# Patient Record
Sex: Male | Born: 1950 | Race: White | Hispanic: No | Marital: Married | Smoking: Never smoker
Health system: Southern US, Community
[De-identification: ages and names within clinical notes are randomized; demographics above are authoritative.]

## PROBLEM LIST (undated history)

## (undated) DIAGNOSIS — I2510 Atherosclerotic heart disease of native coronary artery without angina pectoris: Principal | ICD-10-CM

## (undated) DIAGNOSIS — I10 Essential (primary) hypertension: Secondary | ICD-10-CM

## (undated) DIAGNOSIS — E118 Type 2 diabetes mellitus with unspecified complications: Secondary | ICD-10-CM

## (undated) DIAGNOSIS — Z951 Presence of aortocoronary bypass graft: Secondary | ICD-10-CM

## (undated) DIAGNOSIS — E66811 Obesity, class 1: Secondary | ICD-10-CM

## (undated) DIAGNOSIS — I214 Non-ST elevation (NSTEMI) myocardial infarction: Secondary | ICD-10-CM

## (undated) DIAGNOSIS — G4733 Obstructive sleep apnea (adult) (pediatric): Secondary | ICD-10-CM

## (undated) DIAGNOSIS — E669 Obesity, unspecified: Secondary | ICD-10-CM

## (undated) DIAGNOSIS — Z9861 Coronary angioplasty status: Secondary | ICD-10-CM

## (undated) DIAGNOSIS — E785 Hyperlipidemia, unspecified: Secondary | ICD-10-CM

## (undated) DIAGNOSIS — Z9989 Dependence on other enabling machines and devices: Secondary | ICD-10-CM

## (undated) DIAGNOSIS — I350 Nonrheumatic aortic (valve) stenosis: Secondary | ICD-10-CM

## (undated) DIAGNOSIS — R7989 Other specified abnormal findings of blood chemistry: Secondary | ICD-10-CM

## (undated) HISTORY — DX: Type 2 diabetes mellitus with unspecified complications: E11.8

## (undated) HISTORY — DX: Atherosclerotic heart disease of native coronary artery without angina pectoris: I25.10

## (undated) HISTORY — DX: Other specified abnormal findings of blood chemistry: R79.89

## (undated) HISTORY — PX: VASECTOMY: SHX75

## (undated) HISTORY — DX: Coronary angioplasty status: Z98.61

## (undated) HISTORY — DX: Obesity, unspecified: E66.9

## (undated) HISTORY — PX: NM MYOCAR PERF WALL MOTION: HXRAD629

## (undated) HISTORY — DX: Essential (primary) hypertension: I10

## (undated) HISTORY — DX: Non-ST elevation (NSTEMI) myocardial infarction: I21.4

## (undated) HISTORY — DX: Presence of aortocoronary bypass graft: Z95.1

## (undated) HISTORY — DX: Nonrheumatic aortic (valve) stenosis: I35.0

## (undated) HISTORY — DX: Obstructive sleep apnea (adult) (pediatric): G47.33

## (undated) HISTORY — DX: Hyperlipidemia, unspecified: E78.5

## (undated) HISTORY — DX: Dependence on other enabling machines and devices: Z99.89

## (undated) HISTORY — PX: US ECHOCARDIOGRAPHY: HXRAD669

## (undated) HISTORY — DX: Obesity, class 1: E66.811

---

## 2000-01-06 HISTORY — PX: SHOULDER SURGERY: SHX246

## 2003-01-06 DIAGNOSIS — Z951 Presence of aortocoronary bypass graft: Secondary | ICD-10-CM

## 2003-01-06 DIAGNOSIS — I2510 Atherosclerotic heart disease of native coronary artery without angina pectoris: Secondary | ICD-10-CM

## 2003-01-06 HISTORY — DX: Presence of aortocoronary bypass graft: Z95.1

## 2003-01-06 HISTORY — DX: Atherosclerotic heart disease of native coronary artery without angina pectoris: I25.10

## 2003-07-13 HISTORY — PX: CARDIAC CATHETERIZATION: SHX172

## 2003-08-24 HISTORY — PX: CORONARY ARTERY BYPASS GRAFT: SHX141

## 2009-05-05 DIAGNOSIS — I2510 Atherosclerotic heart disease of native coronary artery without angina pectoris: Secondary | ICD-10-CM | POA: Insufficient documentation

## 2009-05-05 DIAGNOSIS — I214 Non-ST elevation (NSTEMI) myocardial infarction: Secondary | ICD-10-CM

## 2009-05-05 DIAGNOSIS — Z9861 Coronary angioplasty status: Secondary | ICD-10-CM

## 2009-05-05 HISTORY — DX: Atherosclerotic heart disease of native coronary artery without angina pectoris: I25.10

## 2009-05-05 HISTORY — DX: Coronary angioplasty status: Z98.61

## 2009-05-05 HISTORY — DX: Non-ST elevation (NSTEMI) myocardial infarction: I21.4

## 2009-05-15 ENCOUNTER — Encounter: Admission: RE | Admit: 2009-05-15 | Discharge: 2009-05-15 | Payer: Self-pay | Admitting: Cardiology

## 2009-05-17 ENCOUNTER — Ambulatory Visit (HOSPITAL_COMMUNITY): Admission: RE | Admit: 2009-05-17 | Discharge: 2009-05-19 | Payer: Self-pay | Admitting: Cardiology

## 2009-05-17 HISTORY — PX: CORONARY ANGIOPLASTY WITH STENT PLACEMENT: SHX49

## 2010-03-24 LAB — BASIC METABOLIC PANEL
Calcium: 8.9 mg/dL (ref 8.4–10.5)
GFR calc non Af Amer: >60 mL/min (ref >60)
Glucose, Bld: 126 mg/dL — ABNORMAL HIGH (ref 70–99)
Potassium: 4.3 mEq/L (ref 3.5–5.1)
Sodium: 140 mEq/L (ref 135–145)

## 2010-03-24 LAB — CBC
Platelets: 131 10*3/uL — ABNORMAL LOW (ref 150–400)
RBC: 4.46 MIL/uL (ref 4.22–5.81)
WBC: 7.3 10*3/uL (ref 4.0–10.5)

## 2010-03-24 LAB — GLUCOSE, CAPILLARY: Glucose-Capillary: 129 mg/dL — ABNORMAL HIGH (ref 70–99)

## 2010-03-24 LAB — CARDIAC PANEL(CRET KIN+CKTOT+MB+TROPI): CK, MB: 7.6 ng/mL (ref 0.3–4.0)

## 2010-03-25 LAB — GLUCOSE, CAPILLARY
Glucose-Capillary: 142 mg/dL — ABNORMAL HIGH (ref 70–99)
Glucose-Capillary: 156 mg/dL — ABNORMAL HIGH (ref 70–99)
Glucose-Capillary: 223 mg/dL — ABNORMAL HIGH (ref 70–99)
Glucose-Capillary: 272 mg/dL — ABNORMAL HIGH (ref 70–99)

## 2010-03-25 LAB — CBC
Hemoglobin: 13.5 g/dL (ref 13.0–17.0)
MCHC: 35.2 g/dL (ref 30.0–36.0)
RBC: 4.31 MIL/uL (ref 4.22–5.81)
RBC: 4.36 MIL/uL (ref 4.22–5.81)
RDW: 13.1 % (ref 11.5–15.5)
RDW: 13.2 % (ref 11.5–15.5)
WBC: 7.2 10*3/uL (ref 4.0–10.5)

## 2010-03-25 LAB — CARDIAC PANEL(CRET KIN+CKTOT+MB+TROPI)
CK, MB: 16.2 ng/mL (ref 0.3–4.0)
Relative Index: 4.2 — ABNORMAL HIGH (ref 0.0–2.5)
Relative Index: 4.3 — ABNORMAL HIGH (ref 0.0–2.5)
Relative Index: 5.9 — ABNORMAL HIGH (ref 0.0–2.5)
Total CK: 322 U/L — ABNORMAL HIGH (ref 7–232)
Total CK: 353 U/L — ABNORMAL HIGH (ref 7–232)
Troponin I: 3.36 ng/mL (ref 0.00–0.06)
Troponin I: 3.64 ng/mL (ref 0.00–0.06)
Troponin I: 4.77 ng/mL (ref 0.00–0.06)

## 2010-03-25 LAB — BASIC METABOLIC PANEL
BUN: 13 mg/dL (ref 6–23)
CO2: 28 mEq/L (ref 19–32)
Chloride: 100 mEq/L (ref 96–112)
Creatinine, Ser: 1.12 mg/dL (ref 0.4–1.5)
GFR calc Af Amer: >60 mL/min (ref >60)
Glucose, Bld: 100 mg/dL — ABNORMAL HIGH (ref 70–99)
Potassium: 4.4 mEq/L (ref 3.5–5.1)

## 2010-04-06 DIAGNOSIS — I350 Nonrheumatic aortic (valve) stenosis: Secondary | ICD-10-CM

## 2010-04-06 HISTORY — DX: Nonrheumatic aortic (valve) stenosis: I35.0

## 2012-05-23 ENCOUNTER — Other Ambulatory Visit: Payer: Self-pay | Admitting: *Deleted

## 2012-05-23 MED ORDER — SIMVASTATIN 40 MG PO TABS: 40.0000 mg | ORAL_TABLET | Freq: Every day | ORAL | Status: DC

## 2012-08-10 ENCOUNTER — Telehealth: Payer: Self-pay | Admitting: Cardiology

## 2012-08-10 NOTE — Telephone Encounter (Signed)
Please call-was waiting for a letter from Dr Herbie Baltimore for his insurance company. This have been over a month and have not heard anything.

## 2012-08-10 NOTE — Telephone Encounter (Signed)
Message forwarded to S. Martin, RN.  

## 2012-11-22 ENCOUNTER — Other Ambulatory Visit: Payer: Self-pay | Admitting: *Deleted

## 2012-11-22 MED ORDER — LOSARTAN POTASSIUM 100 MG PO TABS: 100.0000 mg | ORAL_TABLET | Freq: Every day | ORAL | Status: DC

## 2012-11-22 NOTE — Telephone Encounter (Signed)
E-scribed losartan 100 mg #30 x 11 refills

## 2013-03-07 ENCOUNTER — Other Ambulatory Visit (HOSPITAL_COMMUNITY): Payer: Self-pay | Admitting: Cardiology

## 2013-03-07 DIAGNOSIS — I350 Nonrheumatic aortic (valve) stenosis: Secondary | ICD-10-CM

## 2013-03-14 ENCOUNTER — Ambulatory Visit (HOSPITAL_COMMUNITY)
Admission: RE | Admit: 2013-03-14 | Discharge: 2013-03-14 | Disposition: A | Discharge status: Home / Self Care | Payer: Commercial Managed Care - PPO | Source: Ambulatory Visit | Attending: Cardiovascular Disease | Admitting: Cardiovascular Disease

## 2013-03-14 DIAGNOSIS — I700 Atherosclerosis of aorta: Secondary | ICD-10-CM | POA: Insufficient documentation

## 2013-03-14 DIAGNOSIS — I359 Nonrheumatic aortic valve disorder, unspecified: Secondary | ICD-10-CM

## 2013-03-14 DIAGNOSIS — I350 Nonrheumatic aortic (valve) stenosis: Secondary | ICD-10-CM

## 2013-03-14 NOTE — Progress Notes (Signed)
2D Echo Performed 03/14/2013    Katrin Grabel, RCS  

## 2013-03-16 ENCOUNTER — Telehealth: Payer: Self-pay | Admitting: *Deleted

## 2013-03-16 DIAGNOSIS — R932 Abnormal findings on diagnostic imaging of liver and biliary tract: Secondary | ICD-10-CM

## 2013-03-16 NOTE — Telephone Encounter (Signed)
Spoke to patient. Result given . Patient is aware that a right quadrant Ultrasound is needed  R/O density in the liver. WILL FORWARD TO Dr Jeneen Rinks LITTLE.Verbalized understanding pa

## 2013-03-16 NOTE — Telephone Encounter (Signed)
Message copied by Raiford Simmonds on Thu Mar 16, 2013  3:38 PM ------      Message from: Leonie Man      Created: Wed Mar 15, 2013  1:49 AM       Normal left ventricular function with mild LVH (wall thickening); abnormal diastolic function, grade 2 with high filling pressures.      Mild aortic stenosis.            Suggestion of a density in the liver.  A dedicated right upper quadrant ultrasound is recommended. -- We should go ahead and order this and forward to PCP. ------

## 2013-03-28 ENCOUNTER — Ambulatory Visit (HOSPITAL_COMMUNITY)
Admission: RE | Admit: 2013-03-28 | Discharge: 2013-03-28 | Disposition: A | Discharge status: Home / Self Care | Payer: Commercial Managed Care - PPO | Source: Ambulatory Visit | Attending: Cardiology | Admitting: Cardiology

## 2013-03-28 DIAGNOSIS — R932 Abnormal findings on diagnostic imaging of liver and biliary tract: Secondary | ICD-10-CM | POA: Insufficient documentation

## 2013-03-28 NOTE — Progress Notes (Signed)
Quick Note:  Reassuring findings on the ultrasound. The area in question: Xience with the gallbladder. There is evidence of multiple gallstones with sludge in the gallbladder with evidence of fatty liver changes.  Will defer further management to his PCP.  Leonie Man, MD  ______

## 2013-04-03 ENCOUNTER — Telehealth: Payer: Self-pay | Admitting: Cardiology

## 2013-04-03 ENCOUNTER — Other Ambulatory Visit (HOSPITAL_COMMUNITY): Payer: Self-pay | Admitting: Cardiology

## 2013-04-03 NOTE — Telephone Encounter (Signed)
Returned call.  No answer and voicemail is work phone.  No message left as pt was not the caller.  Message forwarded to Ivin Booty, [place].

## 2013-04-03 NOTE — Telephone Encounter (Signed)
He had an ultrasound at [facility] last Tuesday. She wants to know if his results are back?

## 2013-04-03 NOTE — Telephone Encounter (Signed)
Rx was sent to pharmacy electronically. 

## 2013-04-04 NOTE — Telephone Encounter (Signed)
Message copied by Raiford Simmonds on Tue Apr 04, 2013  8:38 AM ------      Message from: Leonie Man      Created: Tue Mar 28, 2013 11:47 PM       Reassuring findings on the ultrasound. The area in question: Xience with the gallbladder. There is evidence of multiple gallstones with sludge in the gallbladder with evidence of fatty liver changes.            Will defer further management to his PCP.            Leonie Man, MD       ------

## 2013-04-04 NOTE — Telephone Encounter (Signed)
No answer, left message to call back

## 2013-04-07 NOTE — Telephone Encounter (Signed)
Spoke to patient. Result given . Verbalized understanding Patient states he has an apointment with Dr Tamsen Roers.

## 2013-05-11 ENCOUNTER — Telehealth: Payer: Self-pay | Admitting: Cardiology

## 2013-05-11 ENCOUNTER — Other Ambulatory Visit (HOSPITAL_COMMUNITY): Payer: Self-pay | Admitting: Cardiology

## 2013-05-11 NOTE — Telephone Encounter (Signed)
Rx was sent to pharmacy electronically. 

## 2013-05-12 NOTE — Telephone Encounter (Signed)
closd encounter

## 2013-05-23 ENCOUNTER — Other Ambulatory Visit: Payer: Self-pay | Admitting: Cardiovascular Disease

## 2013-05-23 NOTE — Telephone Encounter (Signed)
Rx was sent to pharmacy electronically. 

## 2013-05-30 ENCOUNTER — Other Ambulatory Visit: Payer: Self-pay | Admitting: Cardiology

## 2013-06-02 NOTE — Telephone Encounter (Signed)
Rx was sent to pharmacy electronically. 

## 2013-06-06 ENCOUNTER — Other Ambulatory Visit (HOSPITAL_COMMUNITY): Payer: Self-pay | Admitting: Cardiology

## 2013-06-13 NOTE — Telephone Encounter (Signed)
Rx was sent to pharmacy electronically. 

## 2013-06-14 ENCOUNTER — Encounter: Payer: Self-pay | Admitting: *Deleted

## 2013-06-16 ENCOUNTER — Encounter: Payer: Self-pay | Admitting: Cardiology

## 2013-06-20 ENCOUNTER — Encounter: Payer: Self-pay | Admitting: Cardiology

## 2013-06-20 ENCOUNTER — Ambulatory Visit (INDEPENDENT_AMBULATORY_CARE_PROVIDER_SITE_OTHER): Payer: Commercial Managed Care - PPO | Admitting: Cardiology

## 2013-06-20 VITALS — BP 130/78 | HR 54 | Ht 68.0 in | Wt 219.1 lb

## 2013-06-20 DIAGNOSIS — Z951 Presence of aortocoronary bypass graft: Secondary | ICD-10-CM

## 2013-06-20 DIAGNOSIS — Z9861 Coronary angioplasty status: Secondary | ICD-10-CM

## 2013-06-20 DIAGNOSIS — G4733 Obstructive sleep apnea (adult) (pediatric): Secondary | ICD-10-CM

## 2013-06-20 DIAGNOSIS — I10 Essential (primary) hypertension: Secondary | ICD-10-CM

## 2013-06-20 DIAGNOSIS — Z9989 Dependence on other enabling machines and devices: Secondary | ICD-10-CM

## 2013-06-20 DIAGNOSIS — E118 Type 2 diabetes mellitus with unspecified complications: Secondary | ICD-10-CM

## 2013-06-20 DIAGNOSIS — E785 Hyperlipidemia, unspecified: Secondary | ICD-10-CM

## 2013-06-20 DIAGNOSIS — I2510 Atherosclerotic heart disease of native coronary artery without angina pectoris: Secondary | ICD-10-CM

## 2013-06-20 DIAGNOSIS — E669 Obesity, unspecified: Secondary | ICD-10-CM

## 2013-06-20 NOTE — Patient Instructions (Signed)
Your physician recommends that you schedule a follow-up appointment in: 1 year  

## 2013-06-24 ENCOUNTER — Encounter: Payer: Self-pay | Admitting: Cardiology

## 2013-06-24 DIAGNOSIS — E118 Type 2 diabetes mellitus with unspecified complications: Secondary | ICD-10-CM | POA: Insufficient documentation

## 2013-06-24 DIAGNOSIS — E66811 Obesity, class 1: Secondary | ICD-10-CM | POA: Insufficient documentation

## 2013-06-24 DIAGNOSIS — I2510 Atherosclerotic heart disease of native coronary artery without angina pectoris: Secondary | ICD-10-CM | POA: Insufficient documentation

## 2013-06-24 DIAGNOSIS — Z9989 Dependence on other enabling machines and devices: Secondary | ICD-10-CM

## 2013-06-24 DIAGNOSIS — E785 Hyperlipidemia, unspecified: Secondary | ICD-10-CM | POA: Insufficient documentation

## 2013-06-24 DIAGNOSIS — E669 Obesity, unspecified: Secondary | ICD-10-CM | POA: Insufficient documentation

## 2013-06-24 DIAGNOSIS — G4733 Obstructive sleep apnea (adult) (pediatric): Secondary | ICD-10-CM | POA: Insufficient documentation

## 2013-06-24 DIAGNOSIS — Z951 Presence of aortocoronary bypass graft: Secondary | ICD-10-CM | POA: Insufficient documentation

## 2013-06-24 DIAGNOSIS — I10 Essential (primary) hypertension: Secondary | ICD-10-CM | POA: Insufficient documentation

## 2013-06-24 NOTE — Assessment & Plan Note (Addendum)
No active angina symptoms. Doing very well. With his weight loss, he is really not a positive high. No active symptoms.  Has 2 large BMS stents in the SVG-diagonal, with a Myoview done since it showed no evidence of ischemia. He is on stable dose of beta blocker, ARB, aspirin and statin.

## 2013-06-24 NOTE — Assessment & Plan Note (Signed)
The last recorded weight I have him as shown in March of last year UA 237 pounds currently down to 218 before that he was 2 and 43 pounds. He still actively working on his diet and try to get much activity as he can do.

## 2013-06-24 NOTE — Assessment & Plan Note (Signed)
Last stress test was in 2012, but probably need followup stress test for graft patency next year.

## 2013-06-24 NOTE — Assessment & Plan Note (Signed)
Blood pressure well controlled on current regimen

## 2013-06-24 NOTE — Assessment & Plan Note (Signed)
Has CAD associated with diabetes. Is on insulin and other oral medications.

## 2013-06-24 NOTE — Assessment & Plan Note (Signed)
On simvastatin, followed by PCP, but I do not have recent labs.

## 2013-06-24 NOTE — Progress Notes (Signed)
PATIENT: Nicholas Rosales MRN: [id number]  DOB: March 12, 1950   DOV:06/24/2013 PCP: Tamsen Roers, MD  Clinic Note: Chief Complaint  Patient presents with  . Annual Exam    NO CHEST PAIN. NO SOB ,SLIGHT EDEMA, LOST 10 LBS    HPI: Nicholas Rosales is a 63 y.o.  male with a PMH below who presents today for 15 month followup of his CAD with other risk factors. He had CABG in 2005 then PCI to the SVG-Diagonal into the LAD. An echocardiogram that suggested possible bicuspid aortic valve with mild. He does have a followup echocardiogram performed which really only showed stable mild aortic stenosis and regurgitation. He had previously planned to start testosterone for low T., but never actually started. He has been doing a whole lot more pain in his diet and trying to get some exercise. He's lost about 20 pounds all told. He gained some of back with family who came to visit for while..  Interval History: he actually says he is feeling better the weight loss. More energy and feels like doing more exercise. He had a fall off a long known to have some pain in his left upper arm. His 80s active chest tightness/pressure consistent with angina or dyspnea with rest or exertion. No PND, orthopnea or edema. No palpitations/irregular heartbeats, syncope/near syncope, TIA/amaurosis fugax.  No melena, hematochezia, hematuria or claudication. He is in much better glycemic control and weight loss. He is trying to get exercise but is also doing a good job watching his diet. The main extent of his exercises doing yard work, but no routine exercise.  Past Medical History  Diagnosis Date  . CAD, multiple vessel 2005    referred for CABG; the LAD:  Proximal 80%, 90% mid; circumflex 100%, nondominant RCA 90%  . S/P CABG x 4 2005    LIMA to LAD, SVG to OM, SVG-PDA, SVG-Diagonal  . CAD S/P percutaneous coronary angioplasty May 2011    Myoview - reversible anterolateral ischemia --> 70% SVG-diagonal --> PCI  . History of  percutaneous coronary intervention May 2011    SVG-Diag: mid - Verflex BMS 4.0 m x 28 mm (4.5 mm), distal 4.0 mm x 12 mm Liberte' BMS (4.5 mm);; Follow-up Myoview 04/2010 - 10 METS, no ischemia or infarction  . Mild aortic stenosis by prior echocardiogram 04/2010    EF 55%, Mod AoV calcification with Mild Stenosis (CRO bicuspid valve);; F/u Echo 03/2013: mild LVH, EF 55-60%, Gr 2DD , mild AS, mod LA dilation,  mild RA & RV dilation  . Non-STEMI (non-ST elevated myocardial infarction) May 2011    likely postprocedural  . Hypertension, essential, benign   . Dyslipidemia, goal LDL below 70   . Type 2 diabetes mellitus with complications     Cad  . OSA on CPAP   . Obesity (BMI 30.0-34.9)   . Low testosterone     Prior Cardiac Evaluation and Past Surgical History: Past Surgical History  Procedure Laterality Date  . Vasectomy    . Shoulder surgery  2002  . Coronary artery bypass graft  08/24/2003    [place]:  LIMA to LAD,SVG to first obtuse marginal,posterior descending to first diagonal.  . Cardiac catheterization  07/13/2003    significant 3 vessel disease  . Coronary angioplasty with stent placement  05/17/2009    SVG to the OM eccentric 70% area - overlapping stents placed  . Nm myocar perf wall motion  04/28/2010    normal  . US echocardiography  04/2010; 03/2013    mod. 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    No Known Allergies  Current Outpatient Prescriptions  Medication Sig Dispense Refill  . aspirin 81 MG tablet Take 81 mg by mouth daily.      . B Complex-C (SUPER B COMPLEX PO) Take 1 tablet by mouth daily.      Marland Kitchen BYSTOLIC 5 MG tablet TAKE ONE TABLET BY MOUTH EVERY DAY  30 tablet  0  . doxazosin (CARDURA) 4 MG tablet Take 4 mg by mouth daily.      . insulin glargine (LANTUS) 100 UNIT/ML injection Inject 20 Units into the skin at bedtime.       Marland Kitchen L-Lysine 500 MG CAPS Take 1 capsule by mouth daily.       Marland Kitchen losartan (COZAAR) 100 MG tablet Take 1 tablet (100 mg total) by mouth daily.  30 tablet  11  . metFORMIN (GLUCOPHAGE) 1000 MG tablet Take 1,000 mg by mouth 2 (two) times daily with a meal.      . omega-3 acid ethyl esters (LOVAZA) 1 G capsule TAKE 4 CAPSULES BY MOUTH AT BEDTIME.  120 capsule  0  . sertraline (ZOLOFT) 50 MG tablet Take 50 mg by mouth daily.      . simvastatin (ZOCOR) 40 MG tablet Take 1 tablet (40 mg total) by mouth daily. <must keep appointment>  90 tablet  0   No current facility-administered medications for this visit.    History   Social History Narrative   Married,Father of 2.   Currently still on disability and unemployed.   Does not smoke. Rare alcohol   Activity limited by shoulder pain.    ROS: A comprehensive Review of Systems - Negative except if not noted above in history of present illness.  PHYSICAL EXAM BP 130/78  Pulse 54  Ht 5\' 8"  (1.727 m)  Wt 219 lb 1.6 oz (99.383 kg)  BMI 33.32 kg/m2 General appearance: alert, cooperative, appears stated age, no distress and mildly obese Neck: no adenopathy, no carotid bruit, no JVD and supple, symmetrical, trachea midline Lungs: clear to auscultation bilaterally, normal percussion bilaterally and nonlabored, good air movement Heart: RRR, normal S1 and S2. 1-2/6C-D, early peaking SEMat L. units be radiating to carotids. Abdomen: soft, non-tender; bowel sounds normal; no masses,  no organomegaly Extremities: extremities normal, atraumatic, no cyanosis or edema   Adult ECG Report  Rate: 54 ;  Rhythm: sinus bradycardia and sinus arrhythmia   Narrative Interpretation: otherwise normal EKG   ASSESSMENT / PLAN: CAD S/P percutaneous coronary angioplasty No active angina symptoms. Doing very well. With his weight loss, he is really not a positive high. No active symptoms.  Has 2 large BMS stents in the SVG-diagonal, with a Myoview done since it showed no evidence of ischemia. He is on stable dose of beta  blocker, ARB, aspirin and statin.    S/P CABG x 4 Last stress test was in 2012, but probably need followup stress test for graft patency next year.  Hypertension, essential, benign Blood pressure well controlled on current regimen.  Dyslipidemia, goal LDL below 70 On simvastatin, followed by PCP, but I do not have recent labs.  Type 2 diabetes mellitus with complications Has CAD associated with diabetes. Is on insulin and other oral medications.  Obesity (BMI 30.0-34.9) The last recorded weight I have him as shown in March of last  year UA 237 pounds currently down to 218 before that he was 2 and 43 pounds. He still actively working on his diet and try to get much activity as he can do.    Orders Placed This Encounter  Procedures  . EKG 12-Lead   No orders of the defined types were placed in this encounter.    Followup: 1 year  DAVID W. Ellyn Hack, M.D., M.S. Interventional Cardiology [facility]

## 2013-07-11 ENCOUNTER — Other Ambulatory Visit: Payer: Self-pay | Admitting: Cardiology

## 2013-07-11 NOTE — Telephone Encounter (Signed)
Rx was sent to pharmacy electronically. 

## 2013-07-17 ENCOUNTER — Other Ambulatory Visit (HOSPITAL_COMMUNITY): Payer: Self-pay | Admitting: Cardiology

## 2013-07-17 NOTE — Telephone Encounter (Signed)
Rx was sent to pharmacy electronically. 

## 2013-08-31 ENCOUNTER — Other Ambulatory Visit: Payer: Self-pay | Admitting: Cardiology

## 2013-08-31 NOTE — Telephone Encounter (Signed)
Rx was sent to pharmacy electronically. 

## 2013-10-20 ENCOUNTER — Telehealth: Payer: Self-pay | Admitting: Cardiology

## 2013-10-20 NOTE — Telephone Encounter (Signed)
Returned call to patient's wife she stated husband went to [facility] ER this past Wed 10/18/13 for chest pain.Stated he was admitted for 1 night.Stated was told he had a irregular heart beat nothing to worry about.Stated he was given Motrin for chest pain and it is relieving pain.Stated they are going out of town next week and wanted Dr.Hardning to check husband to make sure he is ok.Records requested from [facility] scheduled with Dr.Harding Monday 10/23/13 at 2:15 pm.

## 2013-10-20 NOTE — Telephone Encounter (Signed)
OK..  Leonie Man, MD

## 2013-10-20 NOTE — Telephone Encounter (Signed)
Nicholas Rosales is calling because Nicholas Rosales went to the hospital on Tuesday night and they kept him for one night ans says he has an irregular heart beat and she is wanting to speak to someone about it . Please Call .   Thanks

## 2013-10-23 ENCOUNTER — Encounter: Payer: Self-pay | Admitting: Cardiology

## 2013-10-23 ENCOUNTER — Ambulatory Visit (INDEPENDENT_AMBULATORY_CARE_PROVIDER_SITE_OTHER): Payer: Commercial Managed Care - PPO | Admitting: Cardiology

## 2013-10-23 VITALS — BP 152/80 | HR 74 | Ht 69.0 in | Wt 223.2 lb

## 2013-10-23 DIAGNOSIS — E118 Type 2 diabetes mellitus with unspecified complications: Secondary | ICD-10-CM

## 2013-10-23 DIAGNOSIS — E669 Obesity, unspecified: Secondary | ICD-10-CM

## 2013-10-23 DIAGNOSIS — I10 Essential (primary) hypertension: Secondary | ICD-10-CM

## 2013-10-23 DIAGNOSIS — I2583 Coronary atherosclerosis due to lipid rich plaque: Principal | ICD-10-CM

## 2013-10-23 DIAGNOSIS — E785 Hyperlipidemia, unspecified: Secondary | ICD-10-CM

## 2013-10-23 DIAGNOSIS — E66811 Obesity, class 1: Secondary | ICD-10-CM

## 2013-10-23 DIAGNOSIS — I2510 Atherosclerotic heart disease of native coronary artery without angina pectoris: Secondary | ICD-10-CM

## 2013-10-23 DIAGNOSIS — Z9861 Coronary angioplasty status: Secondary | ICD-10-CM

## 2013-10-23 NOTE — Assessment & Plan Note (Signed)
On statin. Followed by PCP. Should be due for labs soon.

## 2013-10-23 NOTE — Assessment & Plan Note (Signed)
Borderline elevated today. We'll need to monitor. He should be seeing his PCP soon. We may need to consider potentially increasing Bystolic.

## 2013-10-23 NOTE — Progress Notes (Signed)
PCP: Tamsen Roers, MD  Clinic Note: Chief Complaint  Patient presents with  . Follow-up    ER visit; chest pains and some dizziness   HPI: Nicholas Rosales is a 63 y.o. male with a PMH below who presents today for postal followup. I last saw him in June of 2015 and he was doing relatively well at that time. He was just admitted to [facility] about a week ago for chest pain and was evaluated with negative biomarkers. He then had a Myoview that was negative for ischemia. The Myoview report is scanned into the results section.  Past Medical History  Diagnosis Date  . CAD, multiple vessel 2005    referred for CABG; the LAD:  Proximal 80%, 90% mid; circumflex 100%, nondominant RCA 90%  . S/P CABG x 4 2005    LIMA to LAD, SVG to OM, SVG-PDA, SVG-Diagonal  . CAD S/P percutaneous coronary angioplasty May 2011    05/2009: Myoview - reversible anterolateral ischemia --> 70% SVG-diagonal --> PCI;; b) Myoview 10/2013 @ [facility]  - No ischmia or infarction with Normal EF.  Marland Kitchen History of percutaneous coronary intervention May 2011    SVG-Diag: mid - Verflex BMS 4.0 m x 28 mm (4.5 mm), distal 4.0 mm x 12 mm Liberte' BMS (4.5 mm);; Follow-up Myoview 04/2010 - 10 METS, no ischemia or infarction  . Mild aortic stenosis by prior echocardiogram 04/2010    EF 55%, Mod AoV calcification with Mild Stenosis (CRO bicuspid valve);; F/u Echo 03/2013: mild LVH, EF 55-60%, Gr 2DD , mild AS, mod LA dilation,  mild RA & RV dilation  . Non-STEMI (non-ST elevated myocardial infarction) May 2011    likely postprocedural  . Hypertension, essential, benign   . Dyslipidemia, goal LDL below 70   . Type 2 diabetes mellitus with complications     Cad  . OSA on CPAP   . Obesity (BMI 30.0-34.9)   . Low testosterone     Prior Cardiovascular Evaluation /Procedure History: Procedure Laterality Date  . Coronary artery bypass graft  08/24/2003    [place]:  LIMA to LAD,SVG to first obtuse  marginal,posterior descending to first diagonal.  . Cardiac catheterization  07/13/2003    significant 3 vessel disease  . Coronary angioplasty with stent placement  05/17/2009    SVG to the OM eccentric 70% area - overlapping stents placed  . Nm myocar perf wall motion  04/28/2010; 10/2013    normal - EF 57% (scanned 10/15 from [facility])   . US echocardiography  04/2010; 03/2013    mod. 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   Interval History: He describes a chest discomfort he had his left upper chest almost midaxillary in nature. The most that treatment though he is pretty quite a bit. Is not necessarily associated with true exertion. No dyspnea associated with it. His hospitalization he has not had that much with her symptoms. Denies any other exertional chest tightness or pressure. No resting or exertional dyspnea.   No PND, orthopnea or edema. No palpitations, lightheadedness, dizziness, weakness or syncope/near syncope. No headache or blurred vision. No TIA/amaurosis fugax symptoms.   ROS: A comprehensive was performed. Review of Systems  Constitutional: Positive for malaise/fatigue.       Currently suffering from mild GI bug with diarrhea and fatigue  HENT: Negative for congestion, nosebleeds and  sore throat.   Eyes: Negative for blurred vision, double vision and discharge.  Respiratory: Positive for cough. Negative for hemoptysis, sputum production, shortness of breath and wheezing.        Occasional dry hacking cough  Cardiovascular: Negative for leg swelling.  Gastrointestinal: Positive for diarrhea. Negative for nausea, vomiting, blood in stool and melena.  Genitourinary: Negative for hematuria.  Musculoskeletal:       Mild arthralgias  Neurological: Positive for dizziness. Negative for sensory change, speech change, focal weakness, seizures, loss of consciousness and headaches.        Mild vertigo dizziness  Endo/Heme/Allergies: Does not bruise/bleed easily.  Psychiatric/Behavioral: Negative for depression. The patient is not nervous/anxious.   All other systems reviewed and are negative.   Current Outpatient Prescriptions on File Prior to Visit  Medication Sig Dispense Refill  . aspirin 81 MG tablet Take 81 mg by mouth daily.      . B Complex-C (SUPER B COMPLEX PO) Take 1 tablet by mouth daily.      Marland Kitchen BYSTOLIC 5 MG tablet TAKE ONE TABLET BY MOUTH EVERY DAY  30 tablet  10  . doxazosin (CARDURA) 4 MG tablet Take 4 mg by mouth daily.      . insulin glargine (LANTUS) 100 UNIT/ML injection Inject 20 Units into the skin at bedtime.       Marland Kitchen L-Lysine 500 MG CAPS Take 1 capsule by mouth daily.      Marland Kitchen losartan (COZAAR) 100 MG tablet Take 1 tablet (100 mg total) by mouth daily.  30 tablet  11  . metFORMIN (GLUCOPHAGE) 1000 MG tablet Take 1,000 mg by mouth 2 (two) times daily with a meal.      . omega-3 acid ethyl esters (LOVAZA) 1 G capsule TAKE 4 CAPSULES BY MOUTH AT BEDTIME.  120 capsule  10  . sertraline (ZOLOFT) 50 MG tablet Take 50 mg by mouth daily.      . simvastatin (ZOCOR) 40 MG tablet Take 1 tablet (40 mg total) by mouth daily.  90 tablet  2   No current facility-administered medications on file prior to visit.   ALLERGIES REVIEWED IN EPIC -- no change SOCIAL AND FAMILY HISTORY REVIEWED IN EPIC -- no change  Wt Readings from Last 3 Encounters:  10/23/13 223 lb 3.2 oz (101.243 kg)  06/20/13 219 lb 1.6 oz (99.383 kg)    PHYSICAL EXAM BP 152/80  Pulse 74  Ht 5\' 9"  (1.753 m)  Wt 223 lb 3.2 oz (101.243 kg)  BMI 32.95 kg/m2 General appearance: alert, cooperative, appears stated age, no distress and mildly obese  Neck: no adenopathy, no carotid bruit, no JVD and supple, symmetrical, trachea midline  Lungs: clear to auscultation bilaterally, normal percussion bilaterally and nonlabored, good air movement  Heart: RRR, normal S1 and S2. 1-2/6C-D, early peaking SEMat  L. units be radiating to carotids.  Abdomen: soft, non-tender; bowel sounds normal; no masses, no organomegaly  Extremities: extremities normal, atraumatic, no cyanosis or edema   Adult ECG Report  Rate: 74 ;  Rhythm: normal sinus rhythm, premature ventricular contractions (PVC) and ~Inc RBBB, ~ LAE  Narrative Interpretation: stable EKG  Recent Labs:  None Available   ASSESSMENT / PLAN: 6 for your coronary disease this was CABG and PCI to the SVG-OM who was just admitted overnight to the hospital it [facility] earlier this month for chest discomfort and dizziness. He was evaluated and a negative Myoview. No longer really having any chest discomfort. This is  similar to what was when I saw him over a year ago. He does have some mild dizziness that sounds almost more a vertigo type in nature.  CAD S/P percutaneous coronary angioplasty I don't think his symptoms he is having are related to angina. He had negative Myoview in the past year and now had one [facility] [place] [facility] with chest discomfort. He is on aspirin plus beta blocker (Bystolic), ARB and simvastatin.  We were planning on checking a followup Myoview this year anyway. We do not not need to do so as it was just done.  Dyslipidemia, goal LDL below 70 On statin. Followed by PCP. Should be due for labs soon.  Hypertension, essential, benign Borderline elevated today. We'll need to monitor. He should be seeing his PCP soon. We may need to consider potentially increasing Bystolic.  Obesity (BMI 30.0-34.9) Continue to recommend dietary modification and exercise. Unfortunately he's gained weight instead of losing.  Type 2 diabetes mellitus with complications Followed by PCP. Currently on insulin and oral medications.    Orders Placed This Encounter  Procedures  . EKG 12-Lead   No orders of the defined types were placed in this encounter.     Followup: Roughly April 2016   Leonie Man, M.D., M.S. Interventional  Cardiologist   Pager # [phone]

## 2013-10-23 NOTE — Assessment & Plan Note (Signed)
I don't think his symptoms he is having are related to angina. He had negative Myoview in the past year and now had one [facility] with chest discomfort. He is on aspirin plus beta blocker (Bystolic), ARB and simvastatin.  We were planning on checking a followup Myoview this year anyway. We do not not need to do so as it was just done.

## 2013-10-23 NOTE — Assessment & Plan Note (Signed)
Continue to recommend dietary modification and exercise. Unfortunately he's gained weight instead of losing.

## 2013-10-23 NOTE — Patient Instructions (Signed)
Your physician recommends that you schedule a follow-up appointment in: April 2016

## 2013-10-23 NOTE — Assessment & Plan Note (Signed)
Followed by PCP. Currently on insulin and oral medications.

## 2013-11-20 ENCOUNTER — Telehealth: Payer: Self-pay | Admitting: *Deleted

## 2013-11-20 NOTE — Telephone Encounter (Signed)
FAXED - SIGNED [place] , ALONG WITH OFFICE NOTE 10/23/13 AND EKG 10/23/13

## 2013-12-04 ENCOUNTER — Other Ambulatory Visit: Payer: Self-pay | Admitting: Cardiology

## 2013-12-04 NOTE — Telephone Encounter (Signed)
Rx was sent to pharmacy electronically. 

## 2014-02-06 ENCOUNTER — Telehealth: Payer: Self-pay | Admitting: Cardiology

## 2014-02-06 NOTE — Telephone Encounter (Signed)
[facility] calling to see if we received fax on this patient. Receipt acknowledged.

## 2014-02-16 ENCOUNTER — Telehealth: Payer: Self-pay | Admitting: *Deleted

## 2014-02-16 NOTE — Telephone Encounter (Signed)
Faxed response to to comments from Dr Ellyn Hack "does this rhythm ever happen without exertion? If not --suspect rate related bundle branch block"

## 2014-02-16 NOTE — Telephone Encounter (Signed)
addendumfrom earlier  Message-- Information was from cardiac rehab session-02/05/14

## 2014-02-27 ENCOUNTER — Telehealth: Payer: Self-pay | Admitting: Cardiology

## 2014-02-28 NOTE — Telephone Encounter (Signed)
Close encounter 

## 2014-03-05 DIAGNOSIS — H401231 Low-tension glaucoma, bilateral, mild stage: Secondary | ICD-10-CM | POA: Diagnosis not present

## 2014-03-05 DIAGNOSIS — E119 Type 2 diabetes mellitus without complications: Secondary | ICD-10-CM | POA: Diagnosis not present

## 2014-03-14 ENCOUNTER — Ambulatory Visit (INDEPENDENT_AMBULATORY_CARE_PROVIDER_SITE_OTHER): Payer: Commercial Managed Care - PPO | Admitting: Cardiology

## 2014-03-14 ENCOUNTER — Encounter: Payer: Self-pay | Admitting: Cardiology

## 2014-03-14 VITALS — BP 112/68 | HR 84 | Ht 68.0 in | Wt 241.9 lb

## 2014-03-14 DIAGNOSIS — I2510 Atherosclerotic heart disease of native coronary artery without angina pectoris: Secondary | ICD-10-CM | POA: Diagnosis not present

## 2014-03-14 DIAGNOSIS — E1159 Type 2 diabetes mellitus with other circulatory complications: Secondary | ICD-10-CM

## 2014-03-14 DIAGNOSIS — E785 Hyperlipidemia, unspecified: Secondary | ICD-10-CM | POA: Diagnosis not present

## 2014-03-14 DIAGNOSIS — I10 Essential (primary) hypertension: Secondary | ICD-10-CM

## 2014-03-14 DIAGNOSIS — Z9861 Coronary angioplasty status: Secondary | ICD-10-CM

## 2014-03-14 DIAGNOSIS — Z79899 Other long term (current) drug therapy: Secondary | ICD-10-CM

## 2014-03-14 DIAGNOSIS — E669 Obesity, unspecified: Secondary | ICD-10-CM

## 2014-03-14 DIAGNOSIS — I493 Ventricular premature depolarization: Secondary | ICD-10-CM

## 2014-03-14 NOTE — Progress Notes (Signed)
PCP: Tamsen Roers, MD  Clinic Note: Chief Complaint  Patient presents with  . ROV 6 months    patient reports discomfort in chest, patient had Tele strip from [facility] recorded 02/05/14.  . Coronary Artery Disease   HPI: Nicholas Rosales is a 64 y.o. male with a PMH below who presents today for postal followup. I last saw him in June of 2015 and he was doing relatively well at that time. He was just admitted to [facility] about a week ago for chest pain and was evaluated with negative biomarkers. He then had a Myoview that was negative for ischemia. The Myoview report is scanned into the results section.  He just recently graduated from [employer] [facility] [employer]) & did quite well.  While there, he was noted to have frequent ectopy with PVCs - singles, couplets & up to 5-6 beats.  Some "rhythms" appeared t be rate related BBB.  No SSx of syncope or near syncope.  He noted that since getting back into exercise, his memory has improved.  Past Medical History  Diagnosis Date  . CAD, multiple vessel 2005    referred for CABG; the LAD:  Proximal 80%, 90% mid; circumflex 100%, nondominant RCA 90%  . S/P CABG x 4 2005    LIMA to LAD, SVG to OM, SVG-PDA, SVG-Diagonal  . CAD S/P percutaneous coronary angioplasty May 2011    05/2009: Myoview - reversible anterolateral ischemia --> 70% SVG-diagonal --> PCI;; b) Myoview 10/2013 @ [facility]  - No ischmia or infarction with Normal EF.  Marland Kitchen History of percutaneous coronary intervention May 2011    SVG-Diag: mid - Verflex BMS 4.0 m x 28 mm (4.5 mm), distal 4.0 mm x 12 mm Liberte' BMS (4.5 mm);; Follow-up Myoview 04/2010 - 10 METS, no ischemia or infarction  . Mild aortic stenosis by prior echocardiogram 04/2010    EF 55%, Mod AoV calcification with Mild Stenosis (CRO bicuspid valve);; F/u Echo 03/2013: mild LVH, EF 55-60%, Gr 2DD , mild AS, mod LA dilation,  mild RA & RV dilation  . Non-STEMI (non-ST elevated myocardial  infarction) May 2011    likely postprocedural  . Hypertension, essential, benign   . Dyslipidemia, goal LDL below 70   . Type 2 diabetes mellitus with complications     Cad  . OSA on CPAP   . Obesity (BMI 30.0-34.9)   . Low testosterone     Prior Cardiovascular Evaluation /Procedure History: Procedure Laterality Date  . Coronary artery bypass graft  08/24/2003    [place]:  LIMA to LAD,SVG to first obtuse marginal,posterior descending to first diagonal.  . Cardiac catheterization  07/13/2003    significant 3 vessel disease  . Coronary angioplasty with stent placement  05/17/2009    SVG to the OM eccentric 70% area - overlapping stents placed  . Nm myocar perf wall motion  04/28/2010; 10/2013    normal - EF 57% (scanned 10/15 from [place])   . US echocardiography  04/2010; 03/2013    mod. 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   Interval History: He presents with occasional episodes of upper chest-midaxillary discomfort that is really not much of a bother for him - at most ~4/10 & not exacerbated with exertion.  This symptoms is relatively similar to the symptoms he had las fall before having the ST  at [facility] last fall. He otherwise denies any other exertional chest tightness or pressure. No resting or exertional dyspnea.   No PND, orthopnea or edema. No palpitations, lightheadedness, dizziness, weakness or syncope/near syncope. No headache or blurred vision. No TIA/amaurosis fugax symptoms.  ROS: A comprehensive was performed. Review of Systems  Constitutional: Negative for fever and chills.  HENT: Positive for congestion (sinus).   Respiratory: Positive for cough (dry hacking). Negative for sputum production and wheezing.   Cardiovascular: Negative for claudication.  Gastrointestinal: Positive for heartburn (occasional). Negative for blood in stool and melena.  Genitourinary:  Negative for hematuria.  Musculoskeletal: Positive for myalgias.  Neurological: Positive for headaches (sinus). Negative for sensory change, speech change, focal weakness, seizures and loss of consciousness.  Endo/Heme/Allergies: Negative.   All other systems reviewed and are negative.   Current Outpatient Prescriptions on File Prior to Visit  Medication Sig Dispense Refill  . aspirin 81 MG tablet Take 81 mg by mouth daily.    . B Complex-C (SUPER B COMPLEX PO) Take 1 tablet by mouth daily.    Marland Kitchen BYSTOLIC 5 MG tablet TAKE ONE TABLET BY MOUTH EVERY DAY 30 tablet 10  . doxazosin (CARDURA) 4 MG tablet Take 4 mg by mouth daily.    . insulin glargine (LANTUS) 100 UNIT/ML injection Inject 20 Units into the skin at bedtime.     Marland Kitchen L-Lysine 500 MG CAPS Take 1 capsule by mouth daily.    Marland Kitchen losartan (COZAAR) 100 MG tablet TAKE 1 TABLET (100 MG TOTAL) BY MOUTH DAILY. 30 tablet 9  . metFORMIN (GLUCOPHAGE) 1000 MG tablet Take 1,000 mg by mouth 2 (two) times daily with a meal.    . omega-3 acid ethyl esters (LOVAZA) 1 G capsule TAKE 4 CAPSULES BY MOUTH AT BEDTIME. 120 capsule 10  . sertraline (ZOLOFT) 50 MG tablet Take 50 mg by mouth daily.    . simvastatin (ZOCOR) 40 MG tablet Take 1 tablet (40 mg total) by mouth daily. 90 tablet 2   No current facility-administered medications on file prior to visit.   No Known Allergies History  Substance Use Topics  . Smoking status: Never Smoker   . Smokeless tobacco: Not on file  . Alcohol Use: No   Family History  Problem Relation Age of Onset  . Heart attack Father     Wt Readings from Last 3 Encounters:  03/14/14 241 lb 14.4 oz (109.725 kg)  10/23/13 223 lb 3.2 oz (101.243 kg)  06/20/13 219 lb 1.6 oz (99.383 kg)    PHYSICAL EXAM BP 112/68 mmHg  Pulse 84  Ht 5\' 8"  (1.727 m)  Wt 241 lb 14.4 oz (109.725 kg)  BMI 36.79 kg/m2 General appearance: alert, cooperative, appears stated age, no distress and mildly obese  Neck: no adenopathy, no carotid  bruit, no JVD and supple, symmetrical, trachea midline  Lungs: clear to auscultation bilaterally, normal percussion bilaterally and nonlabored, good air movement  Heart: RRR, normal S1 and S2. 1-2/6C-D, early peaking SEMat L. units be radiating to carotids.  Abdomen: soft, non-tender; bowel sounds normal; no masses, no organomegaly  Extremities: extremities normal, atraumatic, no cyanosis or edema   Adult ECG Report  Rate: 74 ;  Rhythm: normal sinus rhythm, premature ventricular contractions (PVC) and ~Inc RBBB, ~ LAE  Narrative Interpretation: stable EKG  Recent Labs:  None Available   ASSESSMENT / PLAN: 6 for your coronary disease this was CABG and PCI to the SVG-OM who was just graduated from [employer] [facility] earlier this month -- this was triggered by a hospitalization for chest discomfort and dizziness. He was evaluated and a negative Myoview.  He did well at rehab, but had 1 day with strange rhythms & PVCs noted on telemetry.  He was totally asymptomatic -- ? If rate related BBB.  CAD S/P percutaneous coronary angioplasty Atypical CP symptoms & normal Myoview.   Did well on Heart Strides.  On BB, ARB, statin & ASA.   Dyslipidemia, goal LDL below 70 On statin & Lovaza - no recent labs  Will order today.   Obesity (BMI 30.0-34.9) The patient understands the need to lose weight with diet and exercise. We have discussed specific strategies for this.    Hypertension, essential, benign Excellent control. Continue current regimen     Orders Placed This Encounter  Procedures  . Comprehensive metabolic panel  . Lipid panel  . Hemoglobin A1c   Meds ordered this encounter  Medications  . solifenacin (VESICARE) 10 MG tablet    Sig: Take 10 mg by mouth daily.  . Vitamin D, Ergocalciferol, (DRISDOL) 50000 UNITS CAPS capsule    Sig: Take 50,000 Units by mouth every 7 (seven) days. Wednesday  . Glucosamine-Chondroit-Vit C-Mn (GLUCOSAMINE 1500 COMPLEX) CAPS      Sig: Take 1 capsule by mouth daily.     Followup: ~1 yr   Leonie Man, M.D., M.S. Interventional Cardiologist   Pager # [phone]  ADDENDUM:  Labs drawn today:  Other than TG - Lipids look good.  Need to adjust starchy food intake (if still high next visit - will consider adding fenofibrate)  Lab Results  Component Value Date   CHOL 146 03/14/2014   HDL 32* 03/14/2014   LDLCALC 52 03/14/2014   TRIG 312* 03/14/2014   CHOLHDL 4.6 03/14/2014     Chemistry      Component Value Date/Time   NA 140 03/14/2014 0834   K 5.0 03/14/2014 0834   CL 102 03/14/2014 0834   CO2 27 03/14/2014 0834   BUN 20 03/14/2014 0834   CREATININE 1.07 03/14/2014 0834   CREATININE 0.89 05/19/2009 0454      Component Value Date/Time   CALCIUM 9.2 03/14/2014 0834   ALKPHOS 44 03/14/2014 0834   AST 22 03/14/2014 0834   ALT 25 03/14/2014 0834   BILITOT 1.0 03/14/2014 0834

## 2014-03-14 NOTE — Patient Instructions (Signed)
Dr Ellyn Hack has ordered the following test(s) to be done: 1. Blood work - this must be done FASTING  Dr Ellyn Hack wants you to follow-up in 1 year. You will receive a reminder letter in the mail one months in advance. If you don't receive a letter, please call our office to schedule the follow-up appointment.

## 2014-03-15 LAB — COMPREHENSIVE METABOLIC PANEL
ALBUMIN: 4.4 g/dL (ref 3.5–5.2)
ALT: 25 U/L (ref 0–53)
AST: 22 U/L (ref 0–37)
Alkaline Phosphatase: 44 U/L (ref 39–117)
BUN: 20 mg/dL (ref 6–23)
CALCIUM: 9.2 mg/dL (ref 8.4–10.5)
CHLORIDE: 102 meq/L (ref 96–112)
CO2: 27 mEq/L (ref 19–32)
Creat: 1.07 mg/dL (ref 0.50–1.35)
GLUCOSE: 148 mg/dL — AB (ref 70–99)
POTASSIUM: 5 meq/L (ref 3.5–5.3)
Sodium: 140 mEq/L (ref 135–145)
Total Bilirubin: 1 mg/dL (ref 0.2–1.2)
Total Protein: 6.7 g/dL (ref 6.0–8.3)

## 2014-03-15 LAB — LIPID PANEL
Cholesterol: 146 mg/dL (ref 0–200)
HDL: 32 mg/dL — ABNORMAL LOW (ref >=40)
LDL CALC: 52 mg/dL (ref 0–99)
TRIGLYCERIDES: 312 mg/dL — AB (ref <150)
Total CHOL/HDL Ratio: 4.6 Ratio
VLDL: 62 mg/dL — AB (ref 0–40)

## 2014-03-16 ENCOUNTER — Encounter: Payer: Self-pay | Admitting: Cardiology

## 2014-03-16 DIAGNOSIS — I493 Ventricular premature depolarization: Secondary | ICD-10-CM | POA: Insufficient documentation

## 2014-03-16 LAB — HEMOGLOBIN A1C
Hgb A1c MFr Bld: 6.2 % — ABNORMAL HIGH (ref <5.7)
Mean Plasma Glucose: 131 mg/dL — ABNORMAL HIGH (ref <117)

## 2014-03-16 NOTE — Assessment & Plan Note (Signed)
Excellent control. Continue current regimen

## 2014-03-16 NOTE — Assessment & Plan Note (Signed)
Lots of ventricular ectopy noted on Heart Strides tele - Normal EF & no ischemia on Myoview Relatively asymptomatic - could explain intermittent chest discomfort - but no sustained rhythm.  Continue BB.

## 2014-03-16 NOTE — Assessment & Plan Note (Signed)
On statin & Lovaza - no recent labs  Will order today.

## 2014-03-16 NOTE — Assessment & Plan Note (Signed)
Atypical CP symptoms & normal Myoview.   Did well on Heart Strides.  On BB, ARB, statin & ASA.

## 2014-03-16 NOTE — Assessment & Plan Note (Signed)
The patient understands the need to lose weight with diet and exercise. We have discussed specific strategies for this.  

## 2014-03-19 ENCOUNTER — Encounter: Payer: Self-pay | Admitting: *Deleted

## 2014-03-19 ENCOUNTER — Telehealth: Payer: Self-pay | Admitting: *Deleted

## 2014-03-19 DIAGNOSIS — I2510 Atherosclerotic heart disease of native coronary artery without angina pectoris: Secondary | ICD-10-CM

## 2014-03-19 DIAGNOSIS — Z79899 Other long term (current) drug therapy: Secondary | ICD-10-CM

## 2014-03-19 DIAGNOSIS — E785 Hyperlipidemia, unspecified: Secondary | ICD-10-CM

## 2014-03-19 DIAGNOSIS — Z951 Presence of aortocoronary bypass graft: Secondary | ICD-10-CM

## 2014-03-19 DIAGNOSIS — E118 Type 2 diabetes mellitus with unspecified complications: Secondary | ICD-10-CM

## 2014-03-19 NOTE — Telephone Encounter (Signed)
Spoke to patient. Result given . Verbalized understanding Patient request copy to be sent him.  lab slip in 5-6 month will be mailed

## 2014-03-19 NOTE — Telephone Encounter (Signed)
-----   Message from Leonie Man, MD sent at 03/19/2014  2:26 PM EDT ----- Overall, the labs look pretty good. Glucoses were higher than usual. Kidney function and electrolytes look good. Cholesterol looks pretty good with the exception of the triglycerides. - Reduced despite changing dietary intake. Reducing starchy foods and increasing exercise. This will also help the HDL levels. We can recheck in 6 months. If still elevated at that time I would consider adding fenofibrate.  DH  Pls forward to PCP

## 2014-03-19 NOTE — Telephone Encounter (Signed)
Routed labs to fax machine

## 2014-06-13 ENCOUNTER — Other Ambulatory Visit: Payer: Self-pay | Admitting: Cardiology

## 2014-06-13 NOTE — Telephone Encounter (Signed)
Rx(s) sent to pharmacy electronically.  

## 2014-08-21 ENCOUNTER — Telehealth: Payer: Self-pay | Admitting: *Deleted

## 2014-08-21 DIAGNOSIS — I2510 Atherosclerotic heart disease of native coronary artery without angina pectoris: Secondary | ICD-10-CM

## 2014-08-21 DIAGNOSIS — Z951 Presence of aortocoronary bypass graft: Secondary | ICD-10-CM

## 2014-08-21 DIAGNOSIS — E785 Hyperlipidemia, unspecified: Secondary | ICD-10-CM

## 2014-08-21 DIAGNOSIS — E118 Type 2 diabetes mellitus with unspecified complications: Secondary | ICD-10-CM

## 2014-08-21 DIAGNOSIS — Z79899 Other long term (current) drug therapy: Secondary | ICD-10-CM

## 2014-08-21 NOTE — Telephone Encounter (Signed)
-----   Message from Raiford Simmonds, RN sent at 03/19/2014  3:16 PM EDT ----- Labs in 5- 6 months cmp , lipid  Mail  August 2016 Due in sept 2016

## 2014-08-21 NOTE — Telephone Encounter (Signed)
Mail letter and lab slip cmp ,lipid Due by sept 14 2016

## 2014-09-04 DIAGNOSIS — H40013 Open angle with borderline findings, low risk, bilateral: Secondary | ICD-10-CM | POA: Diagnosis not present

## 2014-09-17 DIAGNOSIS — E782 Mixed hyperlipidemia: Secondary | ICD-10-CM | POA: Diagnosis not present

## 2014-09-17 DIAGNOSIS — E109 Type 1 diabetes mellitus without complications: Secondary | ICD-10-CM | POA: Diagnosis not present

## 2014-09-17 DIAGNOSIS — E291 Testicular hypofunction: Secondary | ICD-10-CM | POA: Diagnosis not present

## 2014-10-15 ENCOUNTER — Other Ambulatory Visit: Payer: Self-pay | Admitting: Cardiology

## 2015-03-14 ENCOUNTER — Other Ambulatory Visit: Payer: Self-pay | Admitting: Cardiology

## 2015-03-14 NOTE — Telephone Encounter (Signed)
Rx(s) sent to pharmacy electronically.  

## 2015-03-20 DIAGNOSIS — E119 Type 2 diabetes mellitus without complications: Secondary | ICD-10-CM | POA: Diagnosis not present

## 2015-04-03 DIAGNOSIS — D3705 Neoplasm of uncertain behavior of pharynx: Secondary | ICD-10-CM | POA: Diagnosis not present

## 2015-04-08 ENCOUNTER — Other Ambulatory Visit (HOSPITAL_COMMUNITY): Payer: Self-pay | Admitting: Otolaryngology

## 2015-04-08 DIAGNOSIS — C099 Malignant neoplasm of tonsil, unspecified: Secondary | ICD-10-CM

## 2015-04-10 ENCOUNTER — Encounter (HOSPITAL_COMMUNITY): Payer: Commercial Managed Care - PPO

## 2015-04-11 DIAGNOSIS — C099 Malignant neoplasm of tonsil, unspecified: Secondary | ICD-10-CM | POA: Diagnosis not present

## 2015-04-11 DIAGNOSIS — C770 Secondary and unspecified malignant neoplasm of lymph nodes of head, face and neck: Secondary | ICD-10-CM | POA: Diagnosis not present

## 2015-04-11 DIAGNOSIS — R937 Abnormal findings on diagnostic imaging of other parts of musculoskeletal system: Secondary | ICD-10-CM | POA: Diagnosis not present

## 2015-04-12 DIAGNOSIS — K8020 Calculus of gallbladder without cholecystitis without obstruction: Secondary | ICD-10-CM | POA: Diagnosis not present

## 2015-04-12 DIAGNOSIS — I10 Essential (primary) hypertension: Secondary | ICD-10-CM | POA: Diagnosis not present

## 2015-04-12 DIAGNOSIS — Z951 Presence of aortocoronary bypass graft: Secondary | ICD-10-CM | POA: Diagnosis not present

## 2015-04-12 DIAGNOSIS — Z794 Long term (current) use of insulin: Secondary | ICD-10-CM | POA: Diagnosis not present

## 2015-04-12 DIAGNOSIS — C099 Malignant neoplasm of tonsil, unspecified: Secondary | ICD-10-CM | POA: Diagnosis not present

## 2015-04-12 DIAGNOSIS — E119 Type 2 diabetes mellitus without complications: Secondary | ICD-10-CM | POA: Diagnosis not present

## 2015-04-12 DIAGNOSIS — C111 Malignant neoplasm of posterior wall of nasopharynx: Secondary | ICD-10-CM | POA: Diagnosis not present

## 2015-04-12 DIAGNOSIS — I2510 Atherosclerotic heart disease of native coronary artery without angina pectoris: Secondary | ICD-10-CM | POA: Diagnosis not present

## 2015-04-12 DIAGNOSIS — E785 Hyperlipidemia, unspecified: Secondary | ICD-10-CM | POA: Diagnosis not present

## 2015-04-12 DIAGNOSIS — Z510 Encounter for antineoplastic radiation therapy: Secondary | ICD-10-CM | POA: Diagnosis not present

## 2015-04-12 DIAGNOSIS — M899 Disorder of bone, unspecified: Secondary | ICD-10-CM | POA: Diagnosis not present

## 2015-04-16 ENCOUNTER — Encounter (HOSPITAL_COMMUNITY): Payer: Commercial Managed Care - PPO

## 2015-04-16 DIAGNOSIS — R609 Edema, unspecified: Secondary | ICD-10-CM | POA: Diagnosis not present

## 2015-04-16 DIAGNOSIS — Z510 Encounter for antineoplastic radiation therapy: Secondary | ICD-10-CM | POA: Diagnosis not present

## 2015-04-16 DIAGNOSIS — C111 Malignant neoplasm of posterior wall of nasopharynx: Secondary | ICD-10-CM | POA: Diagnosis not present

## 2015-04-16 DIAGNOSIS — R590 Localized enlarged lymph nodes: Secondary | ICD-10-CM | POA: Diagnosis not present

## 2015-04-16 DIAGNOSIS — M5030 Other cervical disc degeneration, unspecified cervical region: Secondary | ICD-10-CM | POA: Diagnosis not present

## 2015-04-16 DIAGNOSIS — M899 Disorder of bone, unspecified: Secondary | ICD-10-CM | POA: Diagnosis not present

## 2015-04-16 DIAGNOSIS — I2510 Atherosclerotic heart disease of native coronary artery without angina pectoris: Secondary | ICD-10-CM | POA: Diagnosis not present

## 2015-04-16 DIAGNOSIS — C099 Malignant neoplasm of tonsil, unspecified: Secondary | ICD-10-CM | POA: Diagnosis not present

## 2015-04-16 DIAGNOSIS — K8020 Calculus of gallbladder without cholecystitis without obstruction: Secondary | ICD-10-CM | POA: Diagnosis not present

## 2015-04-16 DIAGNOSIS — Z951 Presence of aortocoronary bypass graft: Secondary | ICD-10-CM | POA: Diagnosis not present

## 2015-04-16 DIAGNOSIS — E119 Type 2 diabetes mellitus without complications: Secondary | ICD-10-CM | POA: Diagnosis not present

## 2015-04-16 DIAGNOSIS — I10 Essential (primary) hypertension: Secondary | ICD-10-CM | POA: Diagnosis not present

## 2015-04-16 DIAGNOSIS — Z794 Long term (current) use of insulin: Secondary | ICD-10-CM | POA: Diagnosis not present

## 2015-04-16 DIAGNOSIS — E785 Hyperlipidemia, unspecified: Secondary | ICD-10-CM | POA: Diagnosis not present

## 2015-04-17 DIAGNOSIS — E119 Type 2 diabetes mellitus without complications: Secondary | ICD-10-CM | POA: Diagnosis not present

## 2015-04-17 DIAGNOSIS — I10 Essential (primary) hypertension: Secondary | ICD-10-CM | POA: Diagnosis not present

## 2015-04-17 DIAGNOSIS — C773 Secondary and unspecified malignant neoplasm of axilla and upper limb lymph nodes: Secondary | ICD-10-CM | POA: Diagnosis not present

## 2015-04-17 DIAGNOSIS — C760 Malignant neoplasm of head, face and neck: Secondary | ICD-10-CM | POA: Diagnosis not present

## 2015-04-17 DIAGNOSIS — R590 Localized enlarged lymph nodes: Secondary | ICD-10-CM | POA: Diagnosis not present

## 2015-04-17 DIAGNOSIS — C099 Malignant neoplasm of tonsil, unspecified: Secondary | ICD-10-CM | POA: Diagnosis not present

## 2015-04-22 DIAGNOSIS — I2510 Atherosclerotic heart disease of native coronary artery without angina pectoris: Secondary | ICD-10-CM | POA: Diagnosis not present

## 2015-04-22 DIAGNOSIS — Z7982 Long term (current) use of aspirin: Secondary | ICD-10-CM | POA: Diagnosis not present

## 2015-04-22 DIAGNOSIS — C099 Malignant neoplasm of tonsil, unspecified: Secondary | ICD-10-CM | POA: Diagnosis not present

## 2015-04-22 DIAGNOSIS — Z794 Long term (current) use of insulin: Secondary | ICD-10-CM | POA: Diagnosis not present

## 2015-04-22 DIAGNOSIS — F329 Major depressive disorder, single episode, unspecified: Secondary | ICD-10-CM | POA: Diagnosis not present

## 2015-04-22 DIAGNOSIS — E119 Type 2 diabetes mellitus without complications: Secondary | ICD-10-CM | POA: Diagnosis not present

## 2015-04-22 DIAGNOSIS — I10 Essential (primary) hypertension: Secondary | ICD-10-CM | POA: Diagnosis not present

## 2015-04-24 DIAGNOSIS — C099 Malignant neoplasm of tonsil, unspecified: Secondary | ICD-10-CM | POA: Diagnosis not present

## 2015-04-26 DIAGNOSIS — Z79899 Other long term (current) drug therapy: Secondary | ICD-10-CM | POA: Diagnosis not present

## 2015-04-26 DIAGNOSIS — C099 Malignant neoplasm of tonsil, unspecified: Secondary | ICD-10-CM | POA: Diagnosis not present

## 2015-04-26 DIAGNOSIS — E119 Type 2 diabetes mellitus without complications: Secondary | ICD-10-CM | POA: Diagnosis not present

## 2015-04-26 DIAGNOSIS — Z951 Presence of aortocoronary bypass graft: Secondary | ICD-10-CM | POA: Diagnosis not present

## 2015-04-26 DIAGNOSIS — Z7982 Long term (current) use of aspirin: Secondary | ICD-10-CM | POA: Diagnosis not present

## 2015-04-26 DIAGNOSIS — E785 Hyperlipidemia, unspecified: Secondary | ICD-10-CM | POA: Diagnosis not present

## 2015-04-26 DIAGNOSIS — Z9842 Cataract extraction status, left eye: Secondary | ICD-10-CM | POA: Diagnosis not present

## 2015-04-26 DIAGNOSIS — Z955 Presence of coronary angioplasty implant and graft: Secondary | ICD-10-CM | POA: Diagnosis not present

## 2015-04-26 DIAGNOSIS — Z794 Long term (current) use of insulin: Secondary | ICD-10-CM | POA: Diagnosis not present

## 2015-04-26 DIAGNOSIS — I10 Essential (primary) hypertension: Secondary | ICD-10-CM | POA: Diagnosis not present

## 2015-04-26 DIAGNOSIS — Z9841 Cataract extraction status, right eye: Secondary | ICD-10-CM | POA: Diagnosis not present

## 2015-04-26 DIAGNOSIS — G4730 Sleep apnea, unspecified: Secondary | ICD-10-CM | POA: Diagnosis not present

## 2015-04-26 DIAGNOSIS — I2510 Atherosclerotic heart disease of native coronary artery without angina pectoris: Secondary | ICD-10-CM | POA: Diagnosis not present

## 2015-04-26 DIAGNOSIS — Z9852 Vasectomy status: Secondary | ICD-10-CM | POA: Diagnosis not present

## 2015-04-27 IMAGING — US US ABDOMEN LIMITED
1 series · 14 of 25 positions shown · non-contrast
Comparison: None.

CLINICAL DATA: echo [DATE]  showed  shadow density on liver

EXAM:
US ABDOMEN LIMITED - RIGHT UPPER QUADRANT

[Series 1: us abdomen limited · 0.27mm/px · 14 of 38 slices shown]
[im 1/38]
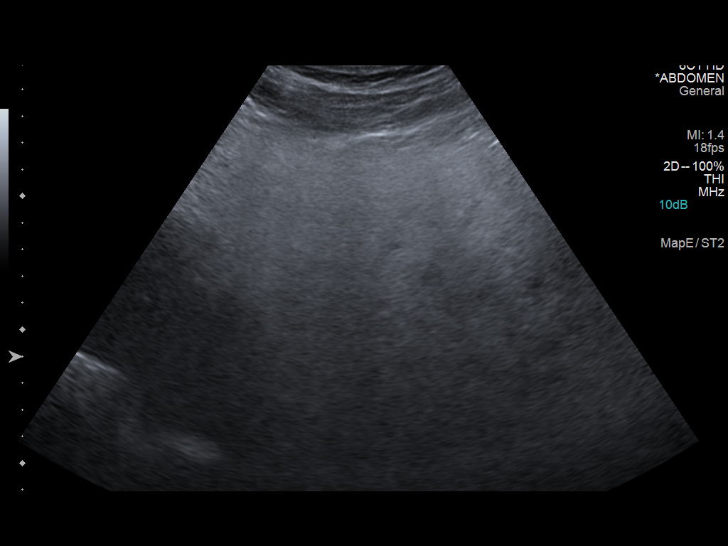
[im 4/38]
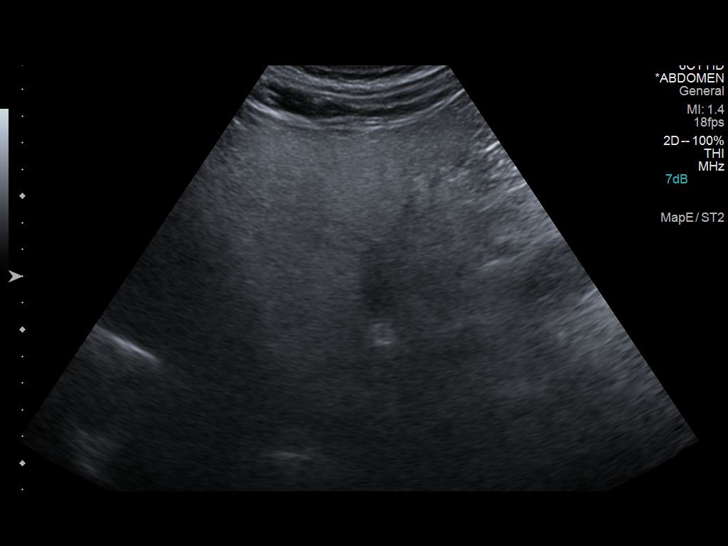
[im 7/38]
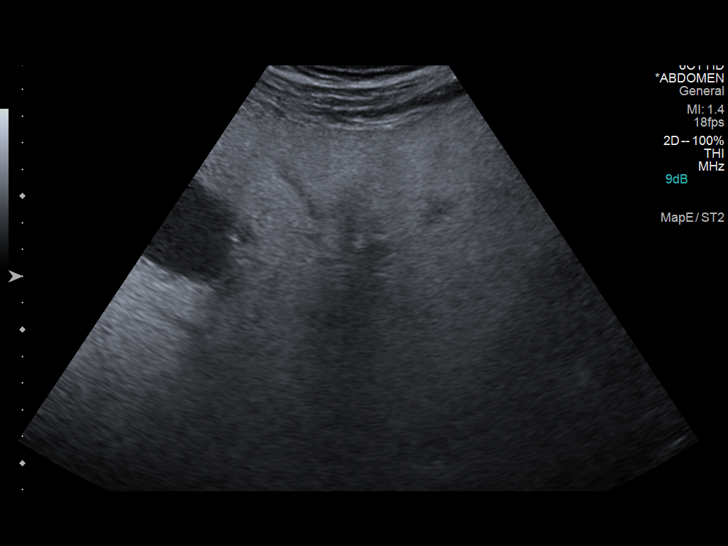
[im 10/38]
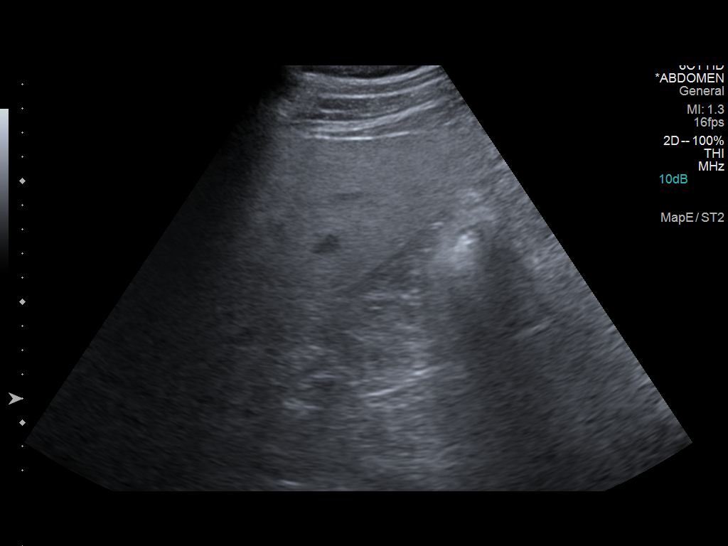
[im 13/38]
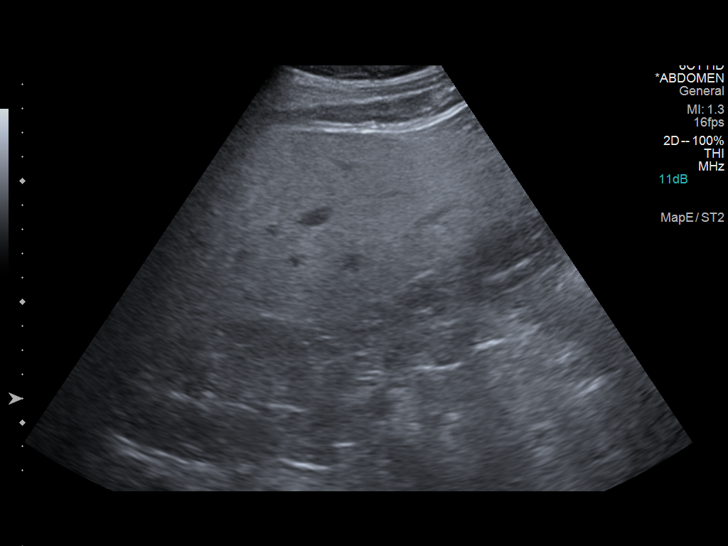
[im 14/38]
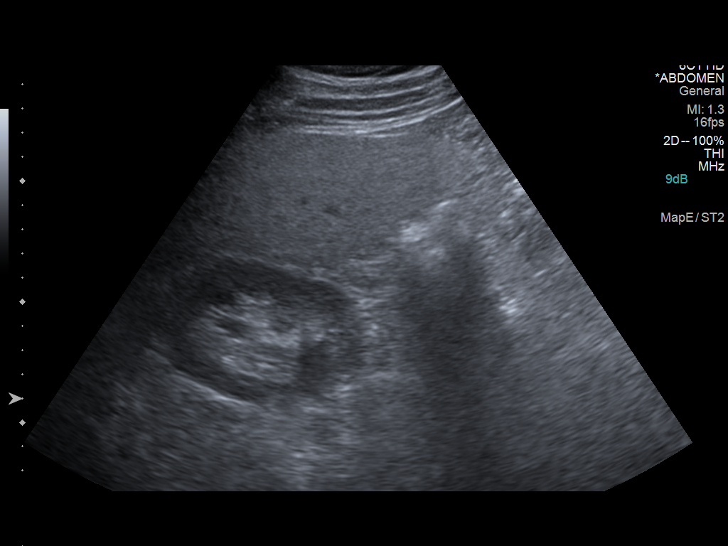
[im 17/38]
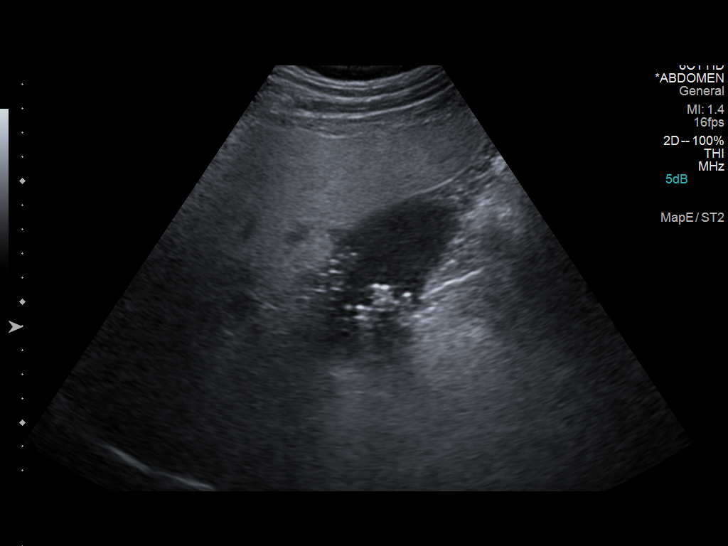
[im 21/38]
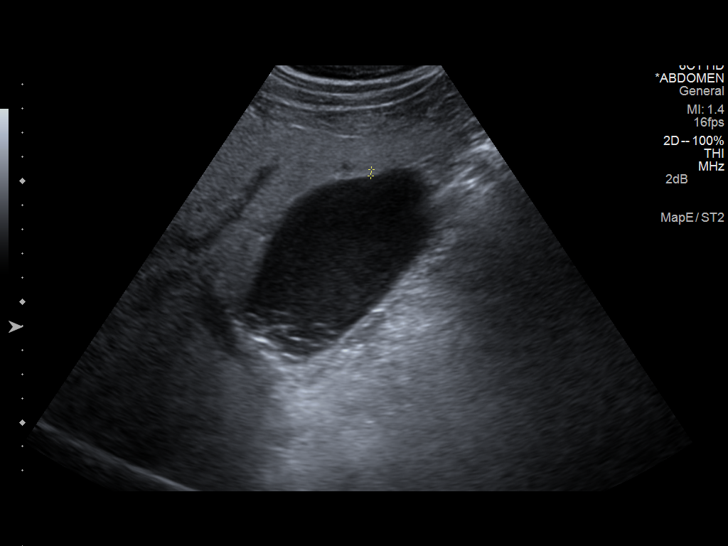
[im 24/38]
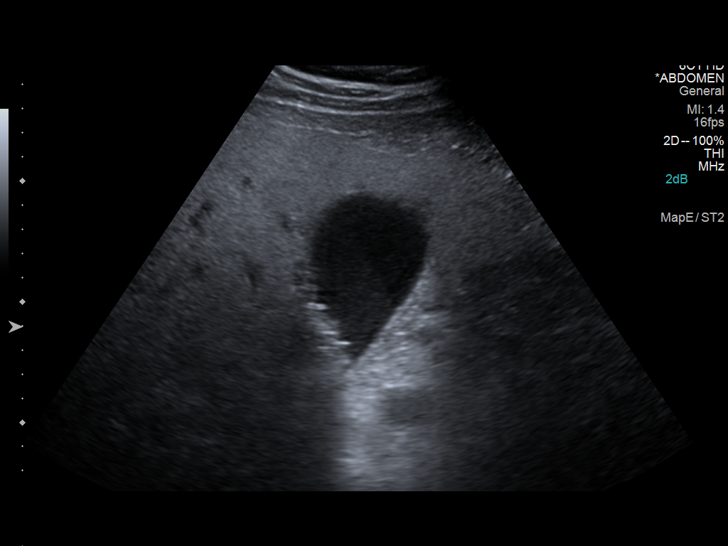
[im 25/38]
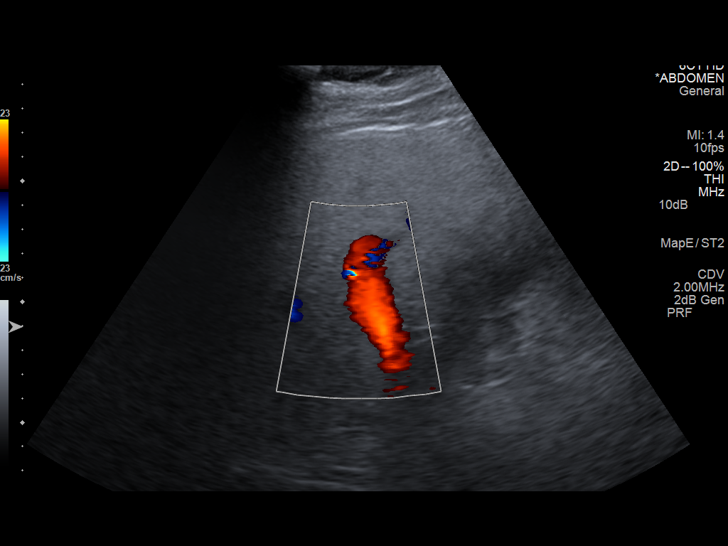
[im 28/38]
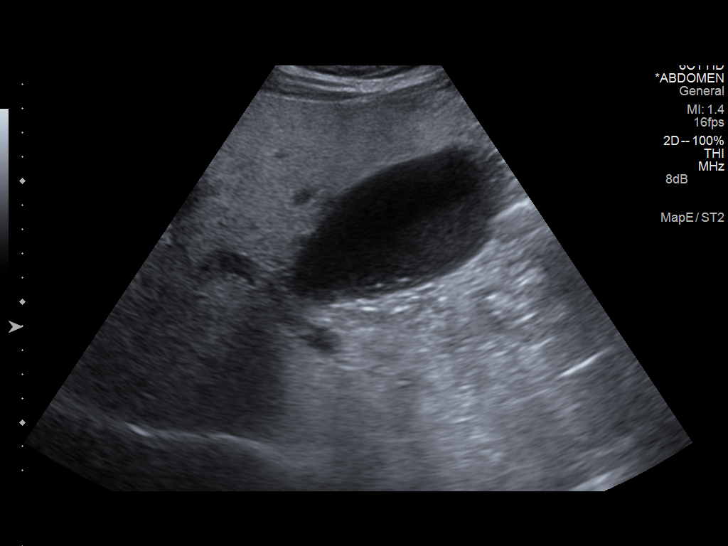
[im 31/38]
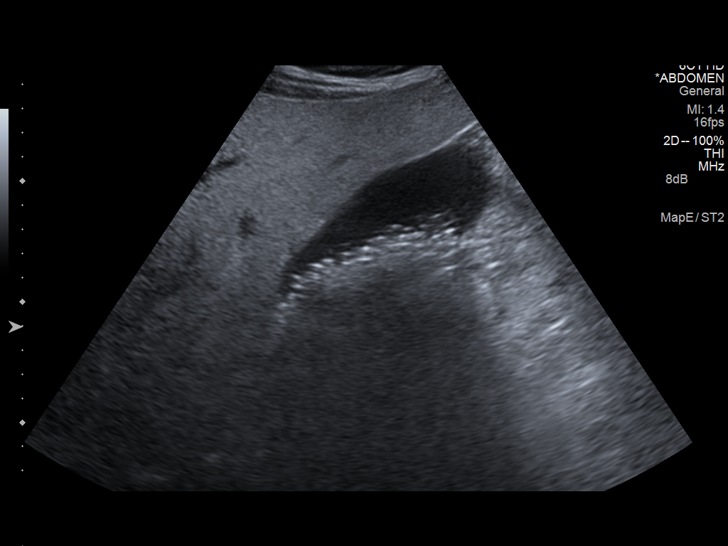
[im 34/38]
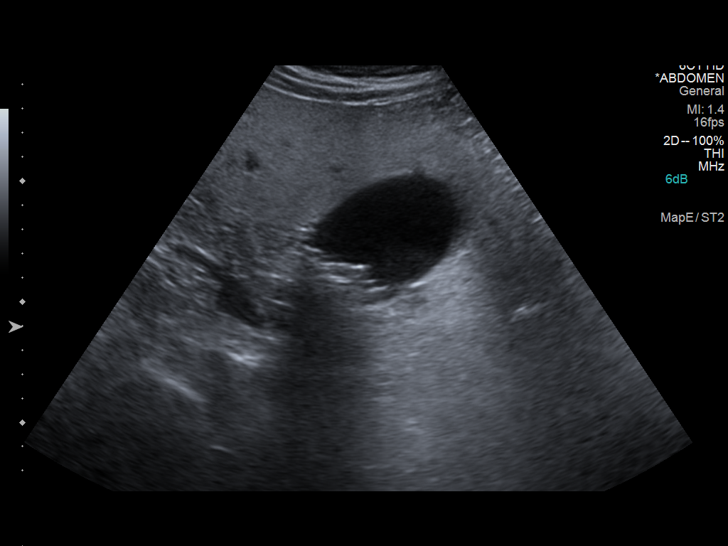
[im 38/38]
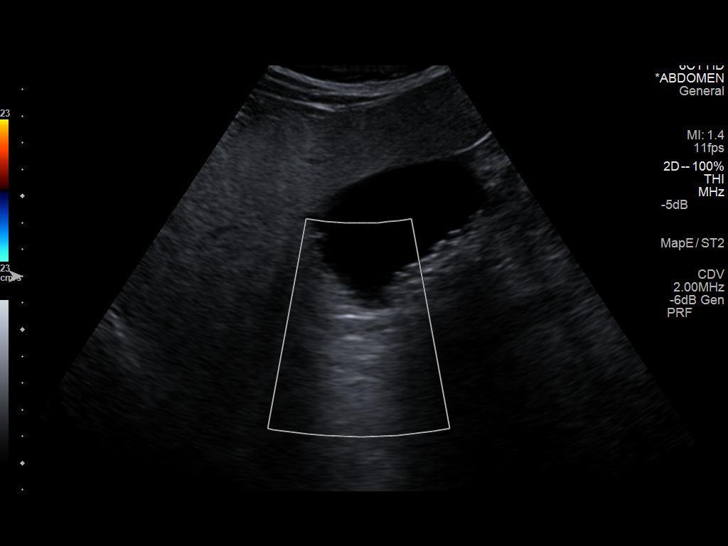

[14 of 25 positions shown; findings below may reference images not displayed]

FINDINGS: Gallbladder:

Multiple gallstones and sludge in is appreciated within the
gallbladder. There is no evidence pericholecystic fluid, gallbladder
wall thickening, nor a sonographic Murphy's sign. Gallbladder wall
thickness is 2.7 mm

Common bile duct:

Diameter: 2.1 mm

Liver:

Diffuse dense echogenic parenchyma.
IMPRESSION: 1. Gallstones without evidence of present gallstones and sludge in
bladder evidence of cholecystitis. The region of concern on prior
echo correlates with the gallbladder.
2. Hepatic steatosis.

## 2015-04-29 DIAGNOSIS — I10 Essential (primary) hypertension: Secondary | ICD-10-CM | POA: Diagnosis not present

## 2015-04-29 DIAGNOSIS — Z510 Encounter for antineoplastic radiation therapy: Secondary | ICD-10-CM | POA: Diagnosis not present

## 2015-04-29 DIAGNOSIS — I2510 Atherosclerotic heart disease of native coronary artery without angina pectoris: Secondary | ICD-10-CM | POA: Diagnosis not present

## 2015-04-29 DIAGNOSIS — C111 Malignant neoplasm of posterior wall of nasopharynx: Secondary | ICD-10-CM | POA: Diagnosis not present

## 2015-04-29 DIAGNOSIS — K8020 Calculus of gallbladder without cholecystitis without obstruction: Secondary | ICD-10-CM | POA: Diagnosis not present

## 2015-04-29 DIAGNOSIS — E119 Type 2 diabetes mellitus without complications: Secondary | ICD-10-CM | POA: Diagnosis not present

## 2015-04-29 DIAGNOSIS — M899 Disorder of bone, unspecified: Secondary | ICD-10-CM | POA: Diagnosis not present

## 2015-04-29 DIAGNOSIS — Z951 Presence of aortocoronary bypass graft: Secondary | ICD-10-CM | POA: Diagnosis not present

## 2015-04-29 DIAGNOSIS — C099 Malignant neoplasm of tonsil, unspecified: Secondary | ICD-10-CM | POA: Diagnosis not present

## 2015-04-29 DIAGNOSIS — E785 Hyperlipidemia, unspecified: Secondary | ICD-10-CM | POA: Diagnosis not present

## 2015-04-29 DIAGNOSIS — Z794 Long term (current) use of insulin: Secondary | ICD-10-CM | POA: Diagnosis not present

## 2015-04-30 DIAGNOSIS — C099 Malignant neoplasm of tonsil, unspecified: Secondary | ICD-10-CM | POA: Diagnosis not present

## 2015-05-01 DIAGNOSIS — E785 Hyperlipidemia, unspecified: Secondary | ICD-10-CM | POA: Diagnosis not present

## 2015-05-01 DIAGNOSIS — Z951 Presence of aortocoronary bypass graft: Secondary | ICD-10-CM | POA: Diagnosis not present

## 2015-05-01 DIAGNOSIS — I10 Essential (primary) hypertension: Secondary | ICD-10-CM | POA: Diagnosis not present

## 2015-05-01 DIAGNOSIS — K8020 Calculus of gallbladder without cholecystitis without obstruction: Secondary | ICD-10-CM | POA: Diagnosis not present

## 2015-05-01 DIAGNOSIS — C099 Malignant neoplasm of tonsil, unspecified: Secondary | ICD-10-CM | POA: Diagnosis not present

## 2015-05-01 DIAGNOSIS — E119 Type 2 diabetes mellitus without complications: Secondary | ICD-10-CM | POA: Diagnosis not present

## 2015-05-01 DIAGNOSIS — I2510 Atherosclerotic heart disease of native coronary artery without angina pectoris: Secondary | ICD-10-CM | POA: Diagnosis not present

## 2015-05-01 DIAGNOSIS — Z5111 Encounter for antineoplastic chemotherapy: Secondary | ICD-10-CM | POA: Diagnosis not present

## 2015-05-01 DIAGNOSIS — Z510 Encounter for antineoplastic radiation therapy: Secondary | ICD-10-CM | POA: Diagnosis not present

## 2015-05-01 DIAGNOSIS — Z794 Long term (current) use of insulin: Secondary | ICD-10-CM | POA: Diagnosis not present

## 2015-05-01 DIAGNOSIS — M899 Disorder of bone, unspecified: Secondary | ICD-10-CM | POA: Diagnosis not present

## 2015-05-01 DIAGNOSIS — C111 Malignant neoplasm of posterior wall of nasopharynx: Secondary | ICD-10-CM | POA: Diagnosis not present

## 2015-05-02 DIAGNOSIS — I10 Essential (primary) hypertension: Secondary | ICD-10-CM | POA: Diagnosis not present

## 2015-05-02 DIAGNOSIS — M899 Disorder of bone, unspecified: Secondary | ICD-10-CM | POA: Diagnosis not present

## 2015-05-02 DIAGNOSIS — K8020 Calculus of gallbladder without cholecystitis without obstruction: Secondary | ICD-10-CM | POA: Diagnosis not present

## 2015-05-02 DIAGNOSIS — I2510 Atherosclerotic heart disease of native coronary artery without angina pectoris: Secondary | ICD-10-CM | POA: Diagnosis not present

## 2015-05-02 DIAGNOSIS — C111 Malignant neoplasm of posterior wall of nasopharynx: Secondary | ICD-10-CM | POA: Diagnosis not present

## 2015-05-02 DIAGNOSIS — Z510 Encounter for antineoplastic radiation therapy: Secondary | ICD-10-CM | POA: Diagnosis not present

## 2015-05-02 DIAGNOSIS — E785 Hyperlipidemia, unspecified: Secondary | ICD-10-CM | POA: Diagnosis not present

## 2015-05-02 DIAGNOSIS — Z794 Long term (current) use of insulin: Secondary | ICD-10-CM | POA: Diagnosis not present

## 2015-05-02 DIAGNOSIS — E119 Type 2 diabetes mellitus without complications: Secondary | ICD-10-CM | POA: Diagnosis not present

## 2015-05-02 DIAGNOSIS — Z951 Presence of aortocoronary bypass graft: Secondary | ICD-10-CM | POA: Diagnosis not present

## 2015-05-03 DIAGNOSIS — M899 Disorder of bone, unspecified: Secondary | ICD-10-CM | POA: Diagnosis not present

## 2015-05-03 DIAGNOSIS — Z510 Encounter for antineoplastic radiation therapy: Secondary | ICD-10-CM | POA: Diagnosis not present

## 2015-05-03 DIAGNOSIS — I10 Essential (primary) hypertension: Secondary | ICD-10-CM | POA: Diagnosis not present

## 2015-05-03 DIAGNOSIS — E119 Type 2 diabetes mellitus without complications: Secondary | ICD-10-CM | POA: Diagnosis not present

## 2015-05-03 DIAGNOSIS — K8020 Calculus of gallbladder without cholecystitis without obstruction: Secondary | ICD-10-CM | POA: Diagnosis not present

## 2015-05-03 DIAGNOSIS — C111 Malignant neoplasm of posterior wall of nasopharynx: Secondary | ICD-10-CM | POA: Diagnosis not present

## 2015-05-06 DIAGNOSIS — I10 Essential (primary) hypertension: Secondary | ICD-10-CM | POA: Diagnosis not present

## 2015-05-06 DIAGNOSIS — B370 Candidal stomatitis: Secondary | ICD-10-CM | POA: Diagnosis not present

## 2015-05-06 DIAGNOSIS — E785 Hyperlipidemia, unspecified: Secondary | ICD-10-CM | POA: Diagnosis not present

## 2015-05-06 DIAGNOSIS — Z951 Presence of aortocoronary bypass graft: Secondary | ICD-10-CM | POA: Diagnosis not present

## 2015-05-06 DIAGNOSIS — M899 Disorder of bone, unspecified: Secondary | ICD-10-CM | POA: Diagnosis not present

## 2015-05-06 DIAGNOSIS — C111 Malignant neoplasm of posterior wall of nasopharynx: Secondary | ICD-10-CM | POA: Diagnosis not present

## 2015-05-06 DIAGNOSIS — Z794 Long term (current) use of insulin: Secondary | ICD-10-CM | POA: Diagnosis not present

## 2015-05-06 DIAGNOSIS — C099 Malignant neoplasm of tonsil, unspecified: Secondary | ICD-10-CM | POA: Diagnosis not present

## 2015-05-06 DIAGNOSIS — E119 Type 2 diabetes mellitus without complications: Secondary | ICD-10-CM | POA: Diagnosis not present

## 2015-05-06 DIAGNOSIS — I2510 Atherosclerotic heart disease of native coronary artery without angina pectoris: Secondary | ICD-10-CM | POA: Diagnosis not present

## 2015-05-06 DIAGNOSIS — K8020 Calculus of gallbladder without cholecystitis without obstruction: Secondary | ICD-10-CM | POA: Diagnosis not present

## 2015-05-06 DIAGNOSIS — Z510 Encounter for antineoplastic radiation therapy: Secondary | ICD-10-CM | POA: Diagnosis not present

## 2015-05-07 DIAGNOSIS — C099 Malignant neoplasm of tonsil, unspecified: Secondary | ICD-10-CM | POA: Diagnosis not present

## 2015-05-07 DIAGNOSIS — B370 Candidal stomatitis: Secondary | ICD-10-CM | POA: Diagnosis not present

## 2015-05-07 DIAGNOSIS — Z79899 Other long term (current) drug therapy: Secondary | ICD-10-CM | POA: Diagnosis not present

## 2015-05-07 DIAGNOSIS — I10 Essential (primary) hypertension: Secondary | ICD-10-CM | POA: Diagnosis not present

## 2015-05-07 DIAGNOSIS — I2510 Atherosclerotic heart disease of native coronary artery without angina pectoris: Secondary | ICD-10-CM | POA: Diagnosis not present

## 2015-05-07 DIAGNOSIS — Z951 Presence of aortocoronary bypass graft: Secondary | ICD-10-CM | POA: Diagnosis not present

## 2015-05-07 DIAGNOSIS — E785 Hyperlipidemia, unspecified: Secondary | ICD-10-CM | POA: Diagnosis not present

## 2015-05-07 DIAGNOSIS — E119 Type 2 diabetes mellitus without complications: Secondary | ICD-10-CM | POA: Diagnosis not present

## 2015-05-07 DIAGNOSIS — M899 Disorder of bone, unspecified: Secondary | ICD-10-CM | POA: Diagnosis not present

## 2015-05-07 DIAGNOSIS — C111 Malignant neoplasm of posterior wall of nasopharynx: Secondary | ICD-10-CM | POA: Diagnosis not present

## 2015-05-07 DIAGNOSIS — Z510 Encounter for antineoplastic radiation therapy: Secondary | ICD-10-CM | POA: Diagnosis not present

## 2015-05-07 DIAGNOSIS — K8020 Calculus of gallbladder without cholecystitis without obstruction: Secondary | ICD-10-CM | POA: Diagnosis not present

## 2015-05-07 DIAGNOSIS — Z794 Long term (current) use of insulin: Secondary | ICD-10-CM | POA: Diagnosis not present

## 2015-05-08 DIAGNOSIS — K8020 Calculus of gallbladder without cholecystitis without obstruction: Secondary | ICD-10-CM | POA: Diagnosis not present

## 2015-05-08 DIAGNOSIS — I10 Essential (primary) hypertension: Secondary | ICD-10-CM | POA: Diagnosis not present

## 2015-05-08 DIAGNOSIS — M899 Disorder of bone, unspecified: Secondary | ICD-10-CM | POA: Diagnosis not present

## 2015-05-08 DIAGNOSIS — B370 Candidal stomatitis: Secondary | ICD-10-CM | POA: Diagnosis not present

## 2015-05-08 DIAGNOSIS — Z951 Presence of aortocoronary bypass graft: Secondary | ICD-10-CM | POA: Diagnosis not present

## 2015-05-08 DIAGNOSIS — Z510 Encounter for antineoplastic radiation therapy: Secondary | ICD-10-CM | POA: Diagnosis not present

## 2015-05-08 DIAGNOSIS — Z5111 Encounter for antineoplastic chemotherapy: Secondary | ICD-10-CM | POA: Diagnosis not present

## 2015-05-08 DIAGNOSIS — C099 Malignant neoplasm of tonsil, unspecified: Secondary | ICD-10-CM | POA: Diagnosis not present

## 2015-05-08 DIAGNOSIS — I2510 Atherosclerotic heart disease of native coronary artery without angina pectoris: Secondary | ICD-10-CM | POA: Diagnosis not present

## 2015-05-08 DIAGNOSIS — E119 Type 2 diabetes mellitus without complications: Secondary | ICD-10-CM | POA: Diagnosis not present

## 2015-05-08 DIAGNOSIS — E785 Hyperlipidemia, unspecified: Secondary | ICD-10-CM | POA: Diagnosis not present

## 2015-05-08 DIAGNOSIS — Z794 Long term (current) use of insulin: Secondary | ICD-10-CM | POA: Diagnosis not present

## 2015-05-08 DIAGNOSIS — C111 Malignant neoplasm of posterior wall of nasopharynx: Secondary | ICD-10-CM | POA: Diagnosis not present

## 2015-05-09 DIAGNOSIS — I10 Essential (primary) hypertension: Secondary | ICD-10-CM | POA: Diagnosis not present

## 2015-05-09 DIAGNOSIS — I2510 Atherosclerotic heart disease of native coronary artery without angina pectoris: Secondary | ICD-10-CM | POA: Diagnosis not present

## 2015-05-09 DIAGNOSIS — M899 Disorder of bone, unspecified: Secondary | ICD-10-CM | POA: Diagnosis not present

## 2015-05-09 DIAGNOSIS — E119 Type 2 diabetes mellitus without complications: Secondary | ICD-10-CM | POA: Diagnosis not present

## 2015-05-09 DIAGNOSIS — E785 Hyperlipidemia, unspecified: Secondary | ICD-10-CM | POA: Diagnosis not present

## 2015-05-09 DIAGNOSIS — C111 Malignant neoplasm of posterior wall of nasopharynx: Secondary | ICD-10-CM | POA: Diagnosis not present

## 2015-05-09 DIAGNOSIS — B370 Candidal stomatitis: Secondary | ICD-10-CM | POA: Diagnosis not present

## 2015-05-09 DIAGNOSIS — C099 Malignant neoplasm of tonsil, unspecified: Secondary | ICD-10-CM | POA: Diagnosis not present

## 2015-05-09 DIAGNOSIS — K8020 Calculus of gallbladder without cholecystitis without obstruction: Secondary | ICD-10-CM | POA: Diagnosis not present

## 2015-05-09 DIAGNOSIS — Z951 Presence of aortocoronary bypass graft: Secondary | ICD-10-CM | POA: Diagnosis not present

## 2015-05-09 DIAGNOSIS — Z510 Encounter for antineoplastic radiation therapy: Secondary | ICD-10-CM | POA: Diagnosis not present

## 2015-05-09 DIAGNOSIS — Z794 Long term (current) use of insulin: Secondary | ICD-10-CM | POA: Diagnosis not present

## 2015-05-10 DIAGNOSIS — I10 Essential (primary) hypertension: Secondary | ICD-10-CM | POA: Diagnosis not present

## 2015-05-10 DIAGNOSIS — C111 Malignant neoplasm of posterior wall of nasopharynx: Secondary | ICD-10-CM | POA: Diagnosis not present

## 2015-05-10 DIAGNOSIS — M899 Disorder of bone, unspecified: Secondary | ICD-10-CM | POA: Diagnosis not present

## 2015-05-10 DIAGNOSIS — K8020 Calculus of gallbladder without cholecystitis without obstruction: Secondary | ICD-10-CM | POA: Diagnosis not present

## 2015-05-10 DIAGNOSIS — E119 Type 2 diabetes mellitus without complications: Secondary | ICD-10-CM | POA: Diagnosis not present

## 2015-05-10 DIAGNOSIS — Z510 Encounter for antineoplastic radiation therapy: Secondary | ICD-10-CM | POA: Diagnosis not present

## 2015-05-13 ENCOUNTER — Other Ambulatory Visit: Payer: Self-pay | Admitting: Cardiology

## 2015-05-13 DIAGNOSIS — Z510 Encounter for antineoplastic radiation therapy: Secondary | ICD-10-CM | POA: Diagnosis not present

## 2015-05-13 DIAGNOSIS — E119 Type 2 diabetes mellitus without complications: Secondary | ICD-10-CM | POA: Diagnosis not present

## 2015-05-13 DIAGNOSIS — C111 Malignant neoplasm of posterior wall of nasopharynx: Secondary | ICD-10-CM | POA: Diagnosis not present

## 2015-05-13 DIAGNOSIS — I10 Essential (primary) hypertension: Secondary | ICD-10-CM | POA: Diagnosis not present

## 2015-05-13 DIAGNOSIS — M899 Disorder of bone, unspecified: Secondary | ICD-10-CM | POA: Diagnosis not present

## 2015-05-13 DIAGNOSIS — K8020 Calculus of gallbladder without cholecystitis without obstruction: Secondary | ICD-10-CM | POA: Diagnosis not present

## 2015-05-13 NOTE — Telephone Encounter (Signed)
REFILL 

## 2015-05-14 DIAGNOSIS — C111 Malignant neoplasm of posterior wall of nasopharynx: Secondary | ICD-10-CM | POA: Diagnosis not present

## 2015-05-14 DIAGNOSIS — Z8249 Family history of ischemic heart disease and other diseases of the circulatory system: Secondary | ICD-10-CM | POA: Diagnosis not present

## 2015-05-14 DIAGNOSIS — Z510 Encounter for antineoplastic radiation therapy: Secondary | ICD-10-CM | POA: Diagnosis not present

## 2015-05-14 DIAGNOSIS — I10 Essential (primary) hypertension: Secondary | ICD-10-CM | POA: Diagnosis not present

## 2015-05-14 DIAGNOSIS — G4730 Sleep apnea, unspecified: Secondary | ICD-10-CM | POA: Diagnosis not present

## 2015-05-14 DIAGNOSIS — Z951 Presence of aortocoronary bypass graft: Secondary | ICD-10-CM | POA: Diagnosis not present

## 2015-05-14 DIAGNOSIS — M899 Disorder of bone, unspecified: Secondary | ICD-10-CM | POA: Diagnosis not present

## 2015-05-14 DIAGNOSIS — M1990 Unspecified osteoarthritis, unspecified site: Secondary | ICD-10-CM | POA: Diagnosis not present

## 2015-05-14 DIAGNOSIS — D696 Thrombocytopenia, unspecified: Secondary | ICD-10-CM | POA: Diagnosis not present

## 2015-05-14 DIAGNOSIS — Z7982 Long term (current) use of aspirin: Secondary | ICD-10-CM | POA: Diagnosis not present

## 2015-05-14 DIAGNOSIS — F329 Major depressive disorder, single episode, unspecified: Secondary | ICD-10-CM | POA: Diagnosis not present

## 2015-05-14 DIAGNOSIS — C7989 Secondary malignant neoplasm of other specified sites: Secondary | ICD-10-CM | POA: Diagnosis not present

## 2015-05-14 DIAGNOSIS — E119 Type 2 diabetes mellitus without complications: Secondary | ICD-10-CM | POA: Diagnosis not present

## 2015-05-14 DIAGNOSIS — I2510 Atherosclerotic heart disease of native coronary artery without angina pectoris: Secondary | ICD-10-CM | POA: Diagnosis not present

## 2015-05-14 DIAGNOSIS — B370 Candidal stomatitis: Secondary | ICD-10-CM | POA: Diagnosis not present

## 2015-05-14 DIAGNOSIS — E785 Hyperlipidemia, unspecified: Secondary | ICD-10-CM | POA: Diagnosis not present

## 2015-05-14 DIAGNOSIS — Z794 Long term (current) use of insulin: Secondary | ICD-10-CM | POA: Diagnosis not present

## 2015-05-14 DIAGNOSIS — Z7984 Long term (current) use of oral hypoglycemic drugs: Secondary | ICD-10-CM | POA: Diagnosis not present

## 2015-05-14 DIAGNOSIS — C099 Malignant neoplasm of tonsil, unspecified: Secondary | ICD-10-CM | POA: Diagnosis not present

## 2015-05-14 DIAGNOSIS — K8020 Calculus of gallbladder without cholecystitis without obstruction: Secondary | ICD-10-CM | POA: Diagnosis not present

## 2015-05-15 DIAGNOSIS — C111 Malignant neoplasm of posterior wall of nasopharynx: Secondary | ICD-10-CM | POA: Diagnosis not present

## 2015-05-15 DIAGNOSIS — K8020 Calculus of gallbladder without cholecystitis without obstruction: Secondary | ICD-10-CM | POA: Diagnosis not present

## 2015-05-15 DIAGNOSIS — Z794 Long term (current) use of insulin: Secondary | ICD-10-CM | POA: Diagnosis not present

## 2015-05-15 DIAGNOSIS — Z5111 Encounter for antineoplastic chemotherapy: Secondary | ICD-10-CM | POA: Diagnosis not present

## 2015-05-15 DIAGNOSIS — Z951 Presence of aortocoronary bypass graft: Secondary | ICD-10-CM | POA: Diagnosis not present

## 2015-05-15 DIAGNOSIS — Z79899 Other long term (current) drug therapy: Secondary | ICD-10-CM | POA: Diagnosis not present

## 2015-05-15 DIAGNOSIS — M899 Disorder of bone, unspecified: Secondary | ICD-10-CM | POA: Diagnosis not present

## 2015-05-15 DIAGNOSIS — B370 Candidal stomatitis: Secondary | ICD-10-CM | POA: Diagnosis not present

## 2015-05-15 DIAGNOSIS — C099 Malignant neoplasm of tonsil, unspecified: Secondary | ICD-10-CM | POA: Diagnosis not present

## 2015-05-15 DIAGNOSIS — E119 Type 2 diabetes mellitus without complications: Secondary | ICD-10-CM | POA: Diagnosis not present

## 2015-05-15 DIAGNOSIS — E785 Hyperlipidemia, unspecified: Secondary | ICD-10-CM | POA: Diagnosis not present

## 2015-05-15 DIAGNOSIS — I10 Essential (primary) hypertension: Secondary | ICD-10-CM | POA: Diagnosis not present

## 2015-05-15 DIAGNOSIS — I2510 Atherosclerotic heart disease of native coronary artery without angina pectoris: Secondary | ICD-10-CM | POA: Diagnosis not present

## 2015-05-15 DIAGNOSIS — Z510 Encounter for antineoplastic radiation therapy: Secondary | ICD-10-CM | POA: Diagnosis not present

## 2015-05-16 DIAGNOSIS — M899 Disorder of bone, unspecified: Secondary | ICD-10-CM | POA: Diagnosis not present

## 2015-05-16 DIAGNOSIS — I2510 Atherosclerotic heart disease of native coronary artery without angina pectoris: Secondary | ICD-10-CM | POA: Diagnosis not present

## 2015-05-16 DIAGNOSIS — K8020 Calculus of gallbladder without cholecystitis without obstruction: Secondary | ICD-10-CM | POA: Diagnosis not present

## 2015-05-16 DIAGNOSIS — Z510 Encounter for antineoplastic radiation therapy: Secondary | ICD-10-CM | POA: Diagnosis not present

## 2015-05-16 DIAGNOSIS — C111 Malignant neoplasm of posterior wall of nasopharynx: Secondary | ICD-10-CM | POA: Diagnosis not present

## 2015-05-16 DIAGNOSIS — Z951 Presence of aortocoronary bypass graft: Secondary | ICD-10-CM | POA: Diagnosis not present

## 2015-05-16 DIAGNOSIS — C099 Malignant neoplasm of tonsil, unspecified: Secondary | ICD-10-CM | POA: Diagnosis not present

## 2015-05-16 DIAGNOSIS — E119 Type 2 diabetes mellitus without complications: Secondary | ICD-10-CM | POA: Diagnosis not present

## 2015-05-16 DIAGNOSIS — E785 Hyperlipidemia, unspecified: Secondary | ICD-10-CM | POA: Diagnosis not present

## 2015-05-16 DIAGNOSIS — I10 Essential (primary) hypertension: Secondary | ICD-10-CM | POA: Diagnosis not present

## 2015-05-16 DIAGNOSIS — Z794 Long term (current) use of insulin: Secondary | ICD-10-CM | POA: Diagnosis not present

## 2015-05-16 DIAGNOSIS — B370 Candidal stomatitis: Secondary | ICD-10-CM | POA: Diagnosis not present

## 2015-05-17 DIAGNOSIS — K8020 Calculus of gallbladder without cholecystitis without obstruction: Secondary | ICD-10-CM | POA: Diagnosis not present

## 2015-05-17 DIAGNOSIS — Z510 Encounter for antineoplastic radiation therapy: Secondary | ICD-10-CM | POA: Diagnosis not present

## 2015-05-17 DIAGNOSIS — M899 Disorder of bone, unspecified: Secondary | ICD-10-CM | POA: Diagnosis not present

## 2015-05-17 DIAGNOSIS — I10 Essential (primary) hypertension: Secondary | ICD-10-CM | POA: Diagnosis not present

## 2015-05-17 DIAGNOSIS — E119 Type 2 diabetes mellitus without complications: Secondary | ICD-10-CM | POA: Diagnosis not present

## 2015-05-17 DIAGNOSIS — C111 Malignant neoplasm of posterior wall of nasopharynx: Secondary | ICD-10-CM | POA: Diagnosis not present

## 2015-05-20 DIAGNOSIS — E119 Type 2 diabetes mellitus without complications: Secondary | ICD-10-CM | POA: Diagnosis not present

## 2015-05-20 DIAGNOSIS — K8020 Calculus of gallbladder without cholecystitis without obstruction: Secondary | ICD-10-CM | POA: Diagnosis not present

## 2015-05-20 DIAGNOSIS — I10 Essential (primary) hypertension: Secondary | ICD-10-CM | POA: Diagnosis not present

## 2015-05-20 DIAGNOSIS — M899 Disorder of bone, unspecified: Secondary | ICD-10-CM | POA: Diagnosis not present

## 2015-05-20 DIAGNOSIS — C111 Malignant neoplasm of posterior wall of nasopharynx: Secondary | ICD-10-CM | POA: Diagnosis not present

## 2015-05-20 DIAGNOSIS — Z510 Encounter for antineoplastic radiation therapy: Secondary | ICD-10-CM | POA: Diagnosis not present

## 2015-05-21 ENCOUNTER — Other Ambulatory Visit: Payer: Self-pay | Admitting: Cardiology

## 2015-05-21 DIAGNOSIS — E119 Type 2 diabetes mellitus without complications: Secondary | ICD-10-CM | POA: Diagnosis not present

## 2015-05-21 DIAGNOSIS — C099 Malignant neoplasm of tonsil, unspecified: Secondary | ICD-10-CM | POA: Diagnosis not present

## 2015-05-21 DIAGNOSIS — Z7982 Long term (current) use of aspirin: Secondary | ICD-10-CM | POA: Diagnosis not present

## 2015-05-21 DIAGNOSIS — Z510 Encounter for antineoplastic radiation therapy: Secondary | ICD-10-CM | POA: Diagnosis not present

## 2015-05-21 DIAGNOSIS — C770 Secondary and unspecified malignant neoplasm of lymph nodes of head, face and neck: Secondary | ICD-10-CM | POA: Diagnosis not present

## 2015-05-21 DIAGNOSIS — I2510 Atherosclerotic heart disease of native coronary artery without angina pectoris: Secondary | ICD-10-CM | POA: Diagnosis not present

## 2015-05-21 DIAGNOSIS — C111 Malignant neoplasm of posterior wall of nasopharynx: Secondary | ICD-10-CM | POA: Diagnosis not present

## 2015-05-21 DIAGNOSIS — K8020 Calculus of gallbladder without cholecystitis without obstruction: Secondary | ICD-10-CM | POA: Diagnosis not present

## 2015-05-21 DIAGNOSIS — B370 Candidal stomatitis: Secondary | ICD-10-CM | POA: Diagnosis not present

## 2015-05-21 DIAGNOSIS — Z79899 Other long term (current) drug therapy: Secondary | ICD-10-CM | POA: Diagnosis not present

## 2015-05-21 DIAGNOSIS — Z79891 Long term (current) use of opiate analgesic: Secondary | ICD-10-CM | POA: Diagnosis not present

## 2015-05-21 DIAGNOSIS — M899 Disorder of bone, unspecified: Secondary | ICD-10-CM | POA: Diagnosis not present

## 2015-05-21 DIAGNOSIS — M1990 Unspecified osteoarthritis, unspecified site: Secondary | ICD-10-CM | POA: Diagnosis not present

## 2015-05-21 DIAGNOSIS — Z951 Presence of aortocoronary bypass graft: Secondary | ICD-10-CM | POA: Diagnosis not present

## 2015-05-21 DIAGNOSIS — D696 Thrombocytopenia, unspecified: Secondary | ICD-10-CM | POA: Diagnosis not present

## 2015-05-21 DIAGNOSIS — I10 Essential (primary) hypertension: Secondary | ICD-10-CM | POA: Diagnosis not present

## 2015-05-21 DIAGNOSIS — F329 Major depressive disorder, single episode, unspecified: Secondary | ICD-10-CM | POA: Diagnosis not present

## 2015-05-21 DIAGNOSIS — Z794 Long term (current) use of insulin: Secondary | ICD-10-CM | POA: Diagnosis not present

## 2015-05-21 DIAGNOSIS — Z7984 Long term (current) use of oral hypoglycemic drugs: Secondary | ICD-10-CM | POA: Diagnosis not present

## 2015-05-21 DIAGNOSIS — G4730 Sleep apnea, unspecified: Secondary | ICD-10-CM | POA: Diagnosis not present

## 2015-05-21 DIAGNOSIS — E785 Hyperlipidemia, unspecified: Secondary | ICD-10-CM | POA: Diagnosis not present

## 2015-05-21 DIAGNOSIS — Z8249 Family history of ischemic heart disease and other diseases of the circulatory system: Secondary | ICD-10-CM | POA: Diagnosis not present

## 2015-05-21 DIAGNOSIS — K1230 Oral mucositis (ulcerative), unspecified: Secondary | ICD-10-CM | POA: Diagnosis not present

## 2015-05-22 DIAGNOSIS — Z5111 Encounter for antineoplastic chemotherapy: Secondary | ICD-10-CM | POA: Diagnosis not present

## 2015-05-22 DIAGNOSIS — M899 Disorder of bone, unspecified: Secondary | ICD-10-CM | POA: Diagnosis not present

## 2015-05-22 DIAGNOSIS — K8020 Calculus of gallbladder without cholecystitis without obstruction: Secondary | ICD-10-CM | POA: Diagnosis not present

## 2015-05-22 DIAGNOSIS — C099 Malignant neoplasm of tonsil, unspecified: Secondary | ICD-10-CM | POA: Diagnosis not present

## 2015-05-22 DIAGNOSIS — B370 Candidal stomatitis: Secondary | ICD-10-CM | POA: Diagnosis not present

## 2015-05-22 DIAGNOSIS — C111 Malignant neoplasm of posterior wall of nasopharynx: Secondary | ICD-10-CM | POA: Diagnosis not present

## 2015-05-22 DIAGNOSIS — I10 Essential (primary) hypertension: Secondary | ICD-10-CM | POA: Diagnosis not present

## 2015-05-22 DIAGNOSIS — Z951 Presence of aortocoronary bypass graft: Secondary | ICD-10-CM | POA: Diagnosis not present

## 2015-05-22 DIAGNOSIS — I2510 Atherosclerotic heart disease of native coronary artery without angina pectoris: Secondary | ICD-10-CM | POA: Diagnosis not present

## 2015-05-22 DIAGNOSIS — E785 Hyperlipidemia, unspecified: Secondary | ICD-10-CM | POA: Diagnosis not present

## 2015-05-22 DIAGNOSIS — Z794 Long term (current) use of insulin: Secondary | ICD-10-CM | POA: Diagnosis not present

## 2015-05-22 DIAGNOSIS — E119 Type 2 diabetes mellitus without complications: Secondary | ICD-10-CM | POA: Diagnosis not present

## 2015-05-22 DIAGNOSIS — Z510 Encounter for antineoplastic radiation therapy: Secondary | ICD-10-CM | POA: Diagnosis not present

## 2015-05-23 DIAGNOSIS — E785 Hyperlipidemia, unspecified: Secondary | ICD-10-CM | POA: Diagnosis not present

## 2015-05-23 DIAGNOSIS — E119 Type 2 diabetes mellitus without complications: Secondary | ICD-10-CM | POA: Diagnosis not present

## 2015-05-23 DIAGNOSIS — Z510 Encounter for antineoplastic radiation therapy: Secondary | ICD-10-CM | POA: Diagnosis not present

## 2015-05-23 DIAGNOSIS — C111 Malignant neoplasm of posterior wall of nasopharynx: Secondary | ICD-10-CM | POA: Diagnosis not present

## 2015-05-23 DIAGNOSIS — C099 Malignant neoplasm of tonsil, unspecified: Secondary | ICD-10-CM | POA: Diagnosis not present

## 2015-05-23 DIAGNOSIS — K8020 Calculus of gallbladder without cholecystitis without obstruction: Secondary | ICD-10-CM | POA: Diagnosis not present

## 2015-05-23 DIAGNOSIS — Z951 Presence of aortocoronary bypass graft: Secondary | ICD-10-CM | POA: Diagnosis not present

## 2015-05-23 DIAGNOSIS — M899 Disorder of bone, unspecified: Secondary | ICD-10-CM | POA: Diagnosis not present

## 2015-05-23 DIAGNOSIS — I2510 Atherosclerotic heart disease of native coronary artery without angina pectoris: Secondary | ICD-10-CM | POA: Diagnosis not present

## 2015-05-23 DIAGNOSIS — Z794 Long term (current) use of insulin: Secondary | ICD-10-CM | POA: Diagnosis not present

## 2015-05-23 DIAGNOSIS — B370 Candidal stomatitis: Secondary | ICD-10-CM | POA: Diagnosis not present

## 2015-05-23 DIAGNOSIS — I10 Essential (primary) hypertension: Secondary | ICD-10-CM | POA: Diagnosis not present

## 2015-05-24 DIAGNOSIS — I10 Essential (primary) hypertension: Secondary | ICD-10-CM | POA: Diagnosis not present

## 2015-05-24 DIAGNOSIS — C111 Malignant neoplasm of posterior wall of nasopharynx: Secondary | ICD-10-CM | POA: Diagnosis not present

## 2015-05-24 DIAGNOSIS — E119 Type 2 diabetes mellitus without complications: Secondary | ICD-10-CM | POA: Diagnosis not present

## 2015-05-24 DIAGNOSIS — M899 Disorder of bone, unspecified: Secondary | ICD-10-CM | POA: Diagnosis not present

## 2015-05-24 DIAGNOSIS — K8020 Calculus of gallbladder without cholecystitis without obstruction: Secondary | ICD-10-CM | POA: Diagnosis not present

## 2015-05-24 DIAGNOSIS — Z510 Encounter for antineoplastic radiation therapy: Secondary | ICD-10-CM | POA: Diagnosis not present

## 2015-05-27 ENCOUNTER — Telehealth: Payer: Self-pay | Admitting: Cardiology

## 2015-05-27 DIAGNOSIS — M899 Disorder of bone, unspecified: Secondary | ICD-10-CM | POA: Diagnosis not present

## 2015-05-27 DIAGNOSIS — I10 Essential (primary) hypertension: Secondary | ICD-10-CM | POA: Diagnosis not present

## 2015-05-27 DIAGNOSIS — C111 Malignant neoplasm of posterior wall of nasopharynx: Secondary | ICD-10-CM | POA: Diagnosis not present

## 2015-05-27 DIAGNOSIS — Z510 Encounter for antineoplastic radiation therapy: Secondary | ICD-10-CM | POA: Diagnosis not present

## 2015-05-27 DIAGNOSIS — E119 Type 2 diabetes mellitus without complications: Secondary | ICD-10-CM | POA: Diagnosis not present

## 2015-05-27 DIAGNOSIS — K8020 Calculus of gallbladder without cholecystitis without obstruction: Secondary | ICD-10-CM | POA: Diagnosis not present

## 2015-05-27 NOTE — Telephone Encounter (Signed)
New message      Pt's heart rate today is 40.  Pt has not taken heart rate medication this am.  Please call nurse

## 2015-05-27 NOTE — Telephone Encounter (Signed)
SPOKE TO PATIENT PATIENT STATE HE DID TAKE MEDICATION AFTER RETURNING BACK FROM TREATMENT- HEART RATE WAS UP TO 60. PER PATIENT NO ISSUES UNTIL TODAY.  HE STATES HE HAS NOT SEEN ANYONE OTHER THAN ( ONCOLOGIST) SINCE STARTING CHEMO / RADIATION FOR THROAT CANCER   WILL HAVE SCHEDULER MAKE AN APPOINTMENT WITH EXTENDER OR DR HARDING SINCE LAST APPOINTMENT WAS 03/2015.

## 2015-05-28 DIAGNOSIS — E785 Hyperlipidemia, unspecified: Secondary | ICD-10-CM | POA: Diagnosis not present

## 2015-05-28 DIAGNOSIS — Z794 Long term (current) use of insulin: Secondary | ICD-10-CM | POA: Diagnosis not present

## 2015-05-28 DIAGNOSIS — Z951 Presence of aortocoronary bypass graft: Secondary | ICD-10-CM | POA: Diagnosis not present

## 2015-05-28 DIAGNOSIS — C770 Secondary and unspecified malignant neoplasm of lymph nodes of head, face and neck: Secondary | ICD-10-CM | POA: Diagnosis not present

## 2015-05-28 DIAGNOSIS — F329 Major depressive disorder, single episode, unspecified: Secondary | ICD-10-CM | POA: Diagnosis not present

## 2015-05-28 DIAGNOSIS — Z7982 Long term (current) use of aspirin: Secondary | ICD-10-CM | POA: Diagnosis not present

## 2015-05-28 DIAGNOSIS — C111 Malignant neoplasm of posterior wall of nasopharynx: Secondary | ICD-10-CM | POA: Diagnosis not present

## 2015-05-28 DIAGNOSIS — I2510 Atherosclerotic heart disease of native coronary artery without angina pectoris: Secondary | ICD-10-CM | POA: Diagnosis not present

## 2015-05-28 DIAGNOSIS — C099 Malignant neoplasm of tonsil, unspecified: Secondary | ICD-10-CM | POA: Diagnosis not present

## 2015-05-28 DIAGNOSIS — I10 Essential (primary) hypertension: Secondary | ICD-10-CM | POA: Diagnosis not present

## 2015-05-28 DIAGNOSIS — D696 Thrombocytopenia, unspecified: Secondary | ICD-10-CM | POA: Diagnosis not present

## 2015-05-28 DIAGNOSIS — M899 Disorder of bone, unspecified: Secondary | ICD-10-CM | POA: Diagnosis not present

## 2015-05-28 DIAGNOSIS — B370 Candidal stomatitis: Secondary | ICD-10-CM | POA: Diagnosis not present

## 2015-05-28 DIAGNOSIS — M1990 Unspecified osteoarthritis, unspecified site: Secondary | ICD-10-CM | POA: Diagnosis not present

## 2015-05-28 DIAGNOSIS — E119 Type 2 diabetes mellitus without complications: Secondary | ICD-10-CM | POA: Diagnosis not present

## 2015-05-28 DIAGNOSIS — Z510 Encounter for antineoplastic radiation therapy: Secondary | ICD-10-CM | POA: Diagnosis not present

## 2015-05-28 DIAGNOSIS — Z7984 Long term (current) use of oral hypoglycemic drugs: Secondary | ICD-10-CM | POA: Diagnosis not present

## 2015-05-28 DIAGNOSIS — I252 Old myocardial infarction: Secondary | ICD-10-CM | POA: Diagnosis not present

## 2015-05-28 DIAGNOSIS — K8020 Calculus of gallbladder without cholecystitis without obstruction: Secondary | ICD-10-CM | POA: Diagnosis not present

## 2015-05-28 DIAGNOSIS — Z79899 Other long term (current) drug therapy: Secondary | ICD-10-CM | POA: Diagnosis not present

## 2015-05-28 NOTE — Telephone Encounter (Signed)
Pt to see Gaspar Bidding on 5/31.

## 2015-05-29 DIAGNOSIS — Z510 Encounter for antineoplastic radiation therapy: Secondary | ICD-10-CM | POA: Diagnosis not present

## 2015-05-29 DIAGNOSIS — E119 Type 2 diabetes mellitus without complications: Secondary | ICD-10-CM | POA: Diagnosis not present

## 2015-05-29 DIAGNOSIS — M899 Disorder of bone, unspecified: Secondary | ICD-10-CM | POA: Diagnosis not present

## 2015-05-29 DIAGNOSIS — C111 Malignant neoplasm of posterior wall of nasopharynx: Secondary | ICD-10-CM | POA: Diagnosis not present

## 2015-05-29 DIAGNOSIS — K8020 Calculus of gallbladder without cholecystitis without obstruction: Secondary | ICD-10-CM | POA: Diagnosis not present

## 2015-05-29 DIAGNOSIS — C099 Malignant neoplasm of tonsil, unspecified: Secondary | ICD-10-CM | POA: Diagnosis not present

## 2015-05-29 DIAGNOSIS — I10 Essential (primary) hypertension: Secondary | ICD-10-CM | POA: Diagnosis not present

## 2015-05-29 DIAGNOSIS — E785 Hyperlipidemia, unspecified: Secondary | ICD-10-CM | POA: Diagnosis not present

## 2015-05-29 DIAGNOSIS — I2510 Atherosclerotic heart disease of native coronary artery without angina pectoris: Secondary | ICD-10-CM | POA: Diagnosis not present

## 2015-05-29 DIAGNOSIS — Z951 Presence of aortocoronary bypass graft: Secondary | ICD-10-CM | POA: Diagnosis not present

## 2015-05-29 DIAGNOSIS — B370 Candidal stomatitis: Secondary | ICD-10-CM | POA: Diagnosis not present

## 2015-05-29 DIAGNOSIS — Z794 Long term (current) use of insulin: Secondary | ICD-10-CM | POA: Diagnosis not present

## 2015-05-30 DIAGNOSIS — Z794 Long term (current) use of insulin: Secondary | ICD-10-CM | POA: Diagnosis not present

## 2015-05-30 DIAGNOSIS — Z951 Presence of aortocoronary bypass graft: Secondary | ICD-10-CM | POA: Diagnosis not present

## 2015-05-30 DIAGNOSIS — Z510 Encounter for antineoplastic radiation therapy: Secondary | ICD-10-CM | POA: Diagnosis not present

## 2015-05-30 DIAGNOSIS — K8020 Calculus of gallbladder without cholecystitis without obstruction: Secondary | ICD-10-CM | POA: Diagnosis not present

## 2015-05-30 DIAGNOSIS — C111 Malignant neoplasm of posterior wall of nasopharynx: Secondary | ICD-10-CM | POA: Diagnosis not present

## 2015-05-30 DIAGNOSIS — C099 Malignant neoplasm of tonsil, unspecified: Secondary | ICD-10-CM | POA: Diagnosis not present

## 2015-05-30 DIAGNOSIS — E785 Hyperlipidemia, unspecified: Secondary | ICD-10-CM | POA: Diagnosis not present

## 2015-05-30 DIAGNOSIS — B370 Candidal stomatitis: Secondary | ICD-10-CM | POA: Diagnosis not present

## 2015-05-30 DIAGNOSIS — I10 Essential (primary) hypertension: Secondary | ICD-10-CM | POA: Diagnosis not present

## 2015-05-30 DIAGNOSIS — M899 Disorder of bone, unspecified: Secondary | ICD-10-CM | POA: Diagnosis not present

## 2015-05-30 DIAGNOSIS — E119 Type 2 diabetes mellitus without complications: Secondary | ICD-10-CM | POA: Diagnosis not present

## 2015-05-30 DIAGNOSIS — I2510 Atherosclerotic heart disease of native coronary artery without angina pectoris: Secondary | ICD-10-CM | POA: Diagnosis not present

## 2015-05-31 DIAGNOSIS — I10 Essential (primary) hypertension: Secondary | ICD-10-CM | POA: Diagnosis not present

## 2015-05-31 DIAGNOSIS — Z510 Encounter for antineoplastic radiation therapy: Secondary | ICD-10-CM | POA: Diagnosis not present

## 2015-05-31 DIAGNOSIS — B370 Candidal stomatitis: Secondary | ICD-10-CM | POA: Diagnosis not present

## 2015-05-31 DIAGNOSIS — Z951 Presence of aortocoronary bypass graft: Secondary | ICD-10-CM | POA: Diagnosis not present

## 2015-05-31 DIAGNOSIS — K8020 Calculus of gallbladder without cholecystitis without obstruction: Secondary | ICD-10-CM | POA: Diagnosis not present

## 2015-05-31 DIAGNOSIS — Z794 Long term (current) use of insulin: Secondary | ICD-10-CM | POA: Diagnosis not present

## 2015-05-31 DIAGNOSIS — C111 Malignant neoplasm of posterior wall of nasopharynx: Secondary | ICD-10-CM | POA: Diagnosis not present

## 2015-05-31 DIAGNOSIS — E785 Hyperlipidemia, unspecified: Secondary | ICD-10-CM | POA: Diagnosis not present

## 2015-05-31 DIAGNOSIS — M899 Disorder of bone, unspecified: Secondary | ICD-10-CM | POA: Diagnosis not present

## 2015-05-31 DIAGNOSIS — I2510 Atherosclerotic heart disease of native coronary artery without angina pectoris: Secondary | ICD-10-CM | POA: Diagnosis not present

## 2015-05-31 DIAGNOSIS — C099 Malignant neoplasm of tonsil, unspecified: Secondary | ICD-10-CM | POA: Diagnosis not present

## 2015-05-31 DIAGNOSIS — E119 Type 2 diabetes mellitus without complications: Secondary | ICD-10-CM | POA: Diagnosis not present

## 2015-06-04 ENCOUNTER — Other Ambulatory Visit: Payer: Self-pay | Admitting: Cardiology

## 2015-06-04 DIAGNOSIS — K8020 Calculus of gallbladder without cholecystitis without obstruction: Secondary | ICD-10-CM | POA: Diagnosis not present

## 2015-06-04 DIAGNOSIS — E119 Type 2 diabetes mellitus without complications: Secondary | ICD-10-CM | POA: Diagnosis not present

## 2015-06-04 DIAGNOSIS — I10 Essential (primary) hypertension: Secondary | ICD-10-CM | POA: Diagnosis not present

## 2015-06-04 DIAGNOSIS — C111 Malignant neoplasm of posterior wall of nasopharynx: Secondary | ICD-10-CM | POA: Diagnosis not present

## 2015-06-04 DIAGNOSIS — M899 Disorder of bone, unspecified: Secondary | ICD-10-CM | POA: Diagnosis not present

## 2015-06-04 DIAGNOSIS — Z510 Encounter for antineoplastic radiation therapy: Secondary | ICD-10-CM | POA: Diagnosis not present

## 2015-06-05 ENCOUNTER — Encounter: Payer: Self-pay | Admitting: Physician Assistant

## 2015-06-05 ENCOUNTER — Ambulatory Visit (INDEPENDENT_AMBULATORY_CARE_PROVIDER_SITE_OTHER): Payer: Commercial Managed Care - PPO | Admitting: Physician Assistant

## 2015-06-05 ENCOUNTER — Ambulatory Visit: Payer: Commercial Managed Care - PPO | Admitting: Physician Assistant

## 2015-06-05 VITALS — BP 135/74 | HR 92 | Ht 68.0 in | Wt 220.0 lb

## 2015-06-05 DIAGNOSIS — Z510 Encounter for antineoplastic radiation therapy: Secondary | ICD-10-CM | POA: Diagnosis not present

## 2015-06-05 DIAGNOSIS — C099 Malignant neoplasm of tonsil, unspecified: Secondary | ICD-10-CM | POA: Diagnosis not present

## 2015-06-05 DIAGNOSIS — G4733 Obstructive sleep apnea (adult) (pediatric): Secondary | ICD-10-CM

## 2015-06-05 DIAGNOSIS — K8020 Calculus of gallbladder without cholecystitis without obstruction: Secondary | ICD-10-CM | POA: Diagnosis not present

## 2015-06-05 DIAGNOSIS — E785 Hyperlipidemia, unspecified: Secondary | ICD-10-CM | POA: Diagnosis not present

## 2015-06-05 DIAGNOSIS — B370 Candidal stomatitis: Secondary | ICD-10-CM | POA: Diagnosis not present

## 2015-06-05 DIAGNOSIS — M899 Disorder of bone, unspecified: Secondary | ICD-10-CM | POA: Diagnosis not present

## 2015-06-05 DIAGNOSIS — I10 Essential (primary) hypertension: Secondary | ICD-10-CM | POA: Diagnosis not present

## 2015-06-05 DIAGNOSIS — R001 Bradycardia, unspecified: Secondary | ICD-10-CM

## 2015-06-05 DIAGNOSIS — I2510 Atherosclerotic heart disease of native coronary artery without angina pectoris: Secondary | ICD-10-CM | POA: Diagnosis not present

## 2015-06-05 DIAGNOSIS — F329 Major depressive disorder, single episode, unspecified: Secondary | ICD-10-CM | POA: Diagnosis not present

## 2015-06-05 DIAGNOSIS — E119 Type 2 diabetes mellitus without complications: Secondary | ICD-10-CM | POA: Diagnosis not present

## 2015-06-05 DIAGNOSIS — I252 Old myocardial infarction: Secondary | ICD-10-CM | POA: Diagnosis not present

## 2015-06-05 DIAGNOSIS — Z951 Presence of aortocoronary bypass graft: Secondary | ICD-10-CM | POA: Diagnosis not present

## 2015-06-05 DIAGNOSIS — Z9989 Dependence on other enabling machines and devices: Secondary | ICD-10-CM

## 2015-06-05 DIAGNOSIS — Z8249 Family history of ischemic heart disease and other diseases of the circulatory system: Secondary | ICD-10-CM | POA: Diagnosis not present

## 2015-06-05 DIAGNOSIS — Z794 Long term (current) use of insulin: Secondary | ICD-10-CM | POA: Diagnosis not present

## 2015-06-05 DIAGNOSIS — Z7984 Long term (current) use of oral hypoglycemic drugs: Secondary | ICD-10-CM | POA: Diagnosis not present

## 2015-06-05 DIAGNOSIS — C111 Malignant neoplasm of posterior wall of nasopharynx: Secondary | ICD-10-CM | POA: Diagnosis not present

## 2015-06-05 DIAGNOSIS — D696 Thrombocytopenia, unspecified: Secondary | ICD-10-CM | POA: Diagnosis not present

## 2015-06-05 DIAGNOSIS — Z7982 Long term (current) use of aspirin: Secondary | ICD-10-CM | POA: Diagnosis not present

## 2015-06-05 DIAGNOSIS — E669 Obesity, unspecified: Secondary | ICD-10-CM

## 2015-06-05 DIAGNOSIS — Z79899 Other long term (current) drug therapy: Secondary | ICD-10-CM | POA: Diagnosis not present

## 2015-06-05 DIAGNOSIS — G4730 Sleep apnea, unspecified: Secondary | ICD-10-CM | POA: Diagnosis not present

## 2015-06-05 NOTE — Progress Notes (Signed)
Patient ID: Nicholas Rosales, male   DOB: 08-06-1950, 65 y.o.   MRN: [id number]    Date:  06/05/2015   ID:  Nicholas Rosales, DOB Nov 10, 1950, MRN [id number]  PCP:  Tamsen Roers, MD  Primary Cardiologist:  Ellyn Hack  Chief Complaint  Patient presents with  . Follow-up    no chest pain, no shortness of breath, occassional cramping in legs, occassional lightheaded or dizziness     History of Present Illness: Nicholas Rosales is a 65 y.o. obese male with history of coronary artery disease, coronary bypass grafting 4 with a LIMA to the LAD, SVG to OM, SVG to PDA, SVG to diagonal in 2005, (See PCI history below), mild aortic stenosis, hypertension, dyslipidemia, diabetes mellitus, obstructive sleep apnea on CPAP. He was admitted to [facility] In 2015 for chest pain and was evaluated with negative biomarkers. He then had a Myoview October 2015 that was negative for ischemia.  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  He's been undergoing radiation and chemotherapy. On May 23 he was found to be bradycardic with heart rate in the 30s.  His by systolic was discontinued. He reports that he may have been a little bit dizzy but nothing overt. He has not had anything since.    The patient currently denies nausea, vomiting, fever, chest pain, shortness of breath, orthopnea,  PND, cough, congestion, abdominal pain, hematochezia, melena, lower extremity edema, claudication.  Wt Readings from Last 3 Encounters:  06/05/15 220 lb (99.791 kg)  03/14/14 241 lb 14.4 oz (109.725 kg)  10/23/13 223 lb 3.2 oz (101.243 kg)     Past Medical History  Diagnosis Date  . CAD, multiple vessel 2005    referred for CABG; the LAD:  Proximal 80%, 90% mid; circumflex 100%, nondominant RCA 90%  . S/P CABG x 4 2005    LIMA to LAD, SVG to OM, SVG-PDA, SVG-Diagonal  . CAD S/P percutaneous coronary angioplasty May 2011    05/2009: Myoview - reversible  anterolateral ischemia --> 70% SVG-diagonal --> PCI;; b) Myoview 10/2013 @ [facility]  - No ischmia or infarction with Normal EF.  Marland Kitchen History of percutaneous coronary intervention May 2011    SVG-Diag: mid - Verflex BMS 4.0 m x 28 mm (4.5 mm), distal 4.0 mm x 12 mm Liberte' BMS (4.5 mm);; Follow-up Myoview 04/2010 - 10 METS, no ischemia or infarction  . Mild aortic stenosis by prior echocardiogram 04/2010    EF 55%, Mod AoV calcification with Mild Stenosis (CRO bicuspid valve);; F/u Echo 03/2013: mild LVH, EF 55-60%, Gr 2DD , mild AS, mod LA dilation,  mild RA & RV dilation  . Non-STEMI (non-ST elevated myocardial infarction) May 2011    likely postprocedural  . Hypertension, essential, benign   . Dyslipidemia, goal LDL below 70   . Type 2 diabetes mellitus with complications     Cad  . OSA on CPAP   . Obesity (BMI 30.0-34.9)   . Low testosterone     Current Outpatient Prescriptions  Medication Sig Dispense Refill  . B Complex-C (SUPER B COMPLEX PO) Take 1 tablet by mouth daily.    Marland Kitchen doxazosin (CARDURA) 4 MG tablet Take 4 mg by mouth daily.    . fluticasone (FLONASE) 50 MCG/ACT nasal spray Place 1 spray into both nostrils daily.  5  . Glucosamine-Chondroit-Vit C-Mn (GLUCOSAMINE 1500 COMPLEX) CAPS Take 1 capsule by mouth daily.    Marland Kitchen  HYDROcodone-acetaminophen (NORCO/VICODIN) 5-325 MG tablet Take 1 tablet by mouth every 6 (six) hours as needed.    . insulin glargine (LANTUS) 100 UNIT/ML injection Inject 20 Units into the skin at bedtime.     Marland Kitchen L-Lysine 500 MG CAPS Take 1 capsule by mouth daily.    Marland Kitchen losartan (COZAAR) 100 MG tablet TAKE 1 TABLET (100 MG TOTAL) BY MOUTH DAILY. 30 tablet 5  . magnesium oxide (MAG-OX) 400 (241.3 Mg) MG tablet Take 1 tablet by mouth 2 (two) times daily.  3  . metFORMIN (GLUCOPHAGE) 1000 MG tablet Take 1,000 mg by mouth 2 (two) times daily with a meal.    . omega-3 acid ethyl esters (LOVAZA) 1 g capsule TAKE 4 CAPSULES BY MOUTH AT BEDTIME. 120 capsule 10  .  ondansetron (ZOFRAN) 8 MG tablet as needed.  2  . sertraline (ZOLOFT) 50 MG tablet Take 50 mg by mouth daily.    . simvastatin (ZOCOR) 40 MG tablet Take 1 tablet (40 mg total) by mouth daily at 6 PM. 90 tablet 3  . solifenacin (VESICARE) 10 MG tablet Take 10 mg by mouth daily.    . Vitamin D, Ergocalciferol, (DRISDOL) 50000 UNITS CAPS capsule Take 50,000 Units by mouth every 7 (seven) days. Wednesday     No current facility-administered medications for this visit.    Allergies:   No Known Allergies  Social History:  The patient  reports that he has never smoked. He does not have any smokeless tobacco history on file. He reports that he does not drink alcohol or use illicit drugs.   Family history:   Family History  Problem Relation Age of Onset  . Heart attack Father     ROS:  Please see the history of present illness.  All other systems reviewed and negative.   PHYSICAL EXAM: VS:  BP 135/74 mmHg  Pulse 92  Ht 5\' 8"  (1.727 m)  Wt 220 lb (99.791 kg)  BMI 33.46 kg/m2 Obese, well developed, in no acute distress HEENT: Pupils are equal round react to light accommodation extraocular movements are intact.  Neck: no JVDNo cervical lymphadenopathy. Cardiac: Regular rate and rhythm 1 to 2/6 systolic murmur Lungs:  clear to auscultation bilaterally, no wheezing, rhonchi or rales Abd: soft, nontender, positive bowel sounds all quadrants, no hepatosplenomegaly Ext: no lower extremity edema.  2+ radial and dorsalis pedis pulses. Skin: warm and dry.  Large area deep red under his neck which looks like a sunburn which is related to his treatments. Neuro:  Grossly normal       ASSESSMENT AND PLAN:  Problem List Items Addressed This Visit    S/P CABG x 4 (Chronic)   OSA on CPAP (Chronic)   Obesity (BMI 30.0-34.9) (Chronic)   Hypertension, essential, benign (Chronic)   Dyslipidemia, goal LDL below 70 (Chronic)   CAD, multiple vessel (Chronic)   Bradycardia - Primary     Nicholas Rosales  presents for six-month evaluation. In April he was diagnosed with throat cancer and was started on chemotherapy and radiation. He was noted to have bradycardia into the 30s about a week ago during exam at one of his other appointments and his beta blocker was discontinued. Reports he's had a little bit of dizziness but really nothing severe.  According to his  hematology note from today, he was still taking the beta blocker. Heart rate today is 92(some rebound tach?).  I did not find any documented telemetry strips or EKG. If he has any other further  episodes, becomes dizzy, I recommend putting a heart monitor on him to see what actually is happening. He's not had any chest pain. His blood pressures well-controlled. We'll see him back in 3 months. Continue statin, Cozaar, aspirin, low vasa.

## 2015-06-05 NOTE — Patient Instructions (Signed)
Your physician wants you to follow-up in: 6 months or sooner if needed. You will receive a reminder letter in the mail two months in advance. If you don't receive a letter, please call our office to schedule the follow-up appointment.   If you need a refill on your cardiac medications before your next appointment, please call your pharmacy. 

## 2015-06-05 NOTE — Telephone Encounter (Signed)
Rx(s) sent to pharmacy electronically.  

## 2015-06-06 DIAGNOSIS — C099 Malignant neoplasm of tonsil, unspecified: Secondary | ICD-10-CM | POA: Diagnosis not present

## 2015-06-11 DIAGNOSIS — Z79899 Other long term (current) drug therapy: Secondary | ICD-10-CM | POA: Diagnosis not present

## 2015-06-11 DIAGNOSIS — Z8249 Family history of ischemic heart disease and other diseases of the circulatory system: Secondary | ICD-10-CM | POA: Diagnosis not present

## 2015-06-11 DIAGNOSIS — C099 Malignant neoplasm of tonsil, unspecified: Secondary | ICD-10-CM | POA: Diagnosis not present

## 2015-06-11 DIAGNOSIS — Z794 Long term (current) use of insulin: Secondary | ICD-10-CM | POA: Diagnosis not present

## 2015-06-11 DIAGNOSIS — F329 Major depressive disorder, single episode, unspecified: Secondary | ICD-10-CM | POA: Diagnosis not present

## 2015-06-11 DIAGNOSIS — E785 Hyperlipidemia, unspecified: Secondary | ICD-10-CM | POA: Diagnosis not present

## 2015-06-11 DIAGNOSIS — C770 Secondary and unspecified malignant neoplasm of lymph nodes of head, face and neck: Secondary | ICD-10-CM | POA: Diagnosis not present

## 2015-06-11 DIAGNOSIS — I2510 Atherosclerotic heart disease of native coronary artery without angina pectoris: Secondary | ICD-10-CM | POA: Diagnosis not present

## 2015-06-11 DIAGNOSIS — I10 Essential (primary) hypertension: Secondary | ICD-10-CM | POA: Diagnosis not present

## 2015-06-11 DIAGNOSIS — E119 Type 2 diabetes mellitus without complications: Secondary | ICD-10-CM | POA: Diagnosis not present

## 2015-06-11 DIAGNOSIS — D696 Thrombocytopenia, unspecified: Secondary | ICD-10-CM | POA: Diagnosis not present

## 2015-06-17 DIAGNOSIS — D696 Thrombocytopenia, unspecified: Secondary | ICD-10-CM | POA: Diagnosis not present

## 2015-06-17 DIAGNOSIS — C099 Malignant neoplasm of tonsil, unspecified: Secondary | ICD-10-CM | POA: Diagnosis not present

## 2015-07-17 ENCOUNTER — Other Ambulatory Visit: Payer: Self-pay | Admitting: Cardiology

## 2015-07-17 NOTE — Telephone Encounter (Signed)
Spoke to patient I received a message you need refill for Bystolic 5 mg.After reviewing chart Bystolic was stopped due to slow heart beat.Stated cancer Dr.Dr.Harish told him ok to restart heart rate has been normal.Advised I will send message to [place] for advice.

## 2015-07-23 ENCOUNTER — Telehealth: Payer: Self-pay | Admitting: Cardiology

## 2015-07-23 NOTE — Telephone Encounter (Signed)
Returned call to patient's wife.She stated husband just finished with chemo therapy in [place],was told his pulse is too slow and needed to see a cardiologist.Stated they live in [facility] now and wants a referral to see a cardiologist there.Appointment offered at our office, but she wants him to be seen in [facility].Advised she needs to take him to ER there and they can consult cardiologist.Message sent to [place] for review.

## 2015-07-23 NOTE — Telephone Encounter (Signed)
New Message  Pt wife calling to have referral sent to [facility]- Cardiology- fax #- [phone]. Please call back and discuss.

## 2015-07-24 ENCOUNTER — Telehealth: Payer: Self-pay

## 2015-07-24 NOTE — Telephone Encounter (Signed)
I'm fine with him being back on his regular dose of Bystolic [facility]

## 2015-07-24 NOTE — Telephone Encounter (Signed)
I think it makes more sense for the referral to a cardiologist locally to be made by his local cardiologist as opposed to a cardiologist from another location. We will be happy to provide any reports that they require.  Glenetta Hew, MD

## 2015-07-24 NOTE — Telephone Encounter (Signed)
Received a message patient needs a refill for Bystolic.After reviewing chart Bystolic stopped due to slow heart beat.Patient stated cancer Dr.Dr.Harish told him ok to restart heart beat normal.Message sent to [place] for advice.

## 2015-07-25 NOTE — Telephone Encounter (Signed)
Spoke to patient Dr.Harding's advice given.Stated he has already restarted Bystolic 5 mg daily.

## 2015-08-12 ENCOUNTER — Other Ambulatory Visit: Payer: Self-pay | Admitting: Internal Medicine

## 2015-09-10 ENCOUNTER — Other Ambulatory Visit: Payer: Self-pay | Admitting: Cardiology

## 2015-09-10 NOTE — Telephone Encounter (Signed)
Rx(s) sent to pharmacy electronically.  

## 2015-09-15 DIAGNOSIS — I10 Essential (primary) hypertension: Secondary | ICD-10-CM | POA: Diagnosis not present

## 2015-09-15 DIAGNOSIS — Z955 Presence of coronary angioplasty implant and graft: Secondary | ICD-10-CM | POA: Diagnosis not present

## 2015-09-15 DIAGNOSIS — Z8249 Family history of ischemic heart disease and other diseases of the circulatory system: Secondary | ICD-10-CM | POA: Diagnosis not present

## 2015-09-15 DIAGNOSIS — E119 Type 2 diabetes mellitus without complications: Secondary | ICD-10-CM | POA: Diagnosis not present

## 2015-09-15 DIAGNOSIS — S2231XA Fracture of one rib, right side, initial encounter for closed fracture: Secondary | ICD-10-CM | POA: Diagnosis not present

## 2015-09-15 DIAGNOSIS — S7011XA Contusion of right thigh, initial encounter: Secondary | ICD-10-CM | POA: Diagnosis not present

## 2015-09-15 DIAGNOSIS — S40012A Contusion of left shoulder, initial encounter: Secondary | ICD-10-CM | POA: Diagnosis not present

## 2015-09-15 DIAGNOSIS — S20222A Contusion of left back wall of thorax, initial encounter: Secondary | ICD-10-CM | POA: Diagnosis not present

## 2015-09-15 DIAGNOSIS — Z951 Presence of aortocoronary bypass graft: Secondary | ICD-10-CM | POA: Diagnosis not present

## 2015-09-15 DIAGNOSIS — R0781 Pleurodynia: Secondary | ICD-10-CM | POA: Diagnosis not present

## 2015-09-20 ENCOUNTER — Other Ambulatory Visit: Payer: Self-pay | Admitting: Internal Medicine

## 2015-09-25 DIAGNOSIS — E119 Type 2 diabetes mellitus without complications: Secondary | ICD-10-CM | POA: Diagnosis not present

## 2015-09-30 DIAGNOSIS — S2241XA Multiple fractures of ribs, right side, initial encounter for closed fracture: Secondary | ICD-10-CM | POA: Diagnosis not present

## 2015-09-30 DIAGNOSIS — C760 Malignant neoplasm of head, face and neck: Secondary | ICD-10-CM | POA: Diagnosis not present

## 2015-09-30 DIAGNOSIS — C099 Malignant neoplasm of tonsil, unspecified: Secondary | ICD-10-CM | POA: Diagnosis not present

## 2015-10-15 ENCOUNTER — Other Ambulatory Visit: Payer: Self-pay | Admitting: Internal Medicine

## 2015-11-22 DIAGNOSIS — I2510 Atherosclerotic heart disease of native coronary artery without angina pectoris: Secondary | ICD-10-CM | POA: Diagnosis not present

## 2015-11-22 DIAGNOSIS — Z794 Long term (current) use of insulin: Secondary | ICD-10-CM | POA: Diagnosis not present

## 2015-11-22 DIAGNOSIS — K8020 Calculus of gallbladder without cholecystitis without obstruction: Secondary | ICD-10-CM | POA: Diagnosis not present

## 2015-11-22 DIAGNOSIS — C099 Malignant neoplasm of tonsil, unspecified: Secondary | ICD-10-CM | POA: Diagnosis not present

## 2015-11-22 DIAGNOSIS — Z951 Presence of aortocoronary bypass graft: Secondary | ICD-10-CM | POA: Diagnosis not present

## 2015-11-22 DIAGNOSIS — M899 Disorder of bone, unspecified: Secondary | ICD-10-CM | POA: Diagnosis not present

## 2015-11-22 DIAGNOSIS — I10 Essential (primary) hypertension: Secondary | ICD-10-CM | POA: Diagnosis not present

## 2015-11-22 DIAGNOSIS — C111 Malignant neoplasm of posterior wall of nasopharynx: Secondary | ICD-10-CM | POA: Diagnosis not present

## 2015-11-22 DIAGNOSIS — E785 Hyperlipidemia, unspecified: Secondary | ICD-10-CM | POA: Diagnosis not present

## 2015-11-22 DIAGNOSIS — Z510 Encounter for antineoplastic radiation therapy: Secondary | ICD-10-CM | POA: Diagnosis not present

## 2015-11-22 DIAGNOSIS — E119 Type 2 diabetes mellitus without complications: Secondary | ICD-10-CM | POA: Diagnosis not present

## 2015-12-05 DIAGNOSIS — E782 Mixed hyperlipidemia: Secondary | ICD-10-CM | POA: Diagnosis not present

## 2015-12-05 DIAGNOSIS — E109 Type 1 diabetes mellitus without complications: Secondary | ICD-10-CM | POA: Diagnosis not present

## 2016-01-24 DIAGNOSIS — C111 Malignant neoplasm of posterior wall of nasopharynx: Secondary | ICD-10-CM | POA: Diagnosis not present

## 2016-01-24 DIAGNOSIS — C099 Malignant neoplasm of tonsil, unspecified: Secondary | ICD-10-CM | POA: Diagnosis not present

## 2016-01-24 DIAGNOSIS — E785 Hyperlipidemia, unspecified: Secondary | ICD-10-CM | POA: Diagnosis not present

## 2016-01-24 DIAGNOSIS — K8020 Calculus of gallbladder without cholecystitis without obstruction: Secondary | ICD-10-CM | POA: Diagnosis not present

## 2016-01-24 DIAGNOSIS — I2510 Atherosclerotic heart disease of native coronary artery without angina pectoris: Secondary | ICD-10-CM | POA: Diagnosis not present

## 2016-01-24 DIAGNOSIS — Z951 Presence of aortocoronary bypass graft: Secondary | ICD-10-CM | POA: Diagnosis not present

## 2016-01-24 DIAGNOSIS — Z510 Encounter for antineoplastic radiation therapy: Secondary | ICD-10-CM | POA: Diagnosis not present

## 2016-01-24 DIAGNOSIS — Z794 Long term (current) use of insulin: Secondary | ICD-10-CM | POA: Diagnosis not present

## 2016-01-24 DIAGNOSIS — M899 Disorder of bone, unspecified: Secondary | ICD-10-CM | POA: Diagnosis not present

## 2016-01-24 DIAGNOSIS — E119 Type 2 diabetes mellitus without complications: Secondary | ICD-10-CM | POA: Diagnosis not present

## 2016-01-24 DIAGNOSIS — I10 Essential (primary) hypertension: Secondary | ICD-10-CM | POA: Diagnosis not present

## 2016-02-26 DIAGNOSIS — Z794 Long term (current) use of insulin: Secondary | ICD-10-CM | POA: Diagnosis not present

## 2016-02-26 DIAGNOSIS — D696 Thrombocytopenia, unspecified: Secondary | ICD-10-CM | POA: Diagnosis not present

## 2016-02-26 DIAGNOSIS — M1990 Unspecified osteoarthritis, unspecified site: Secondary | ICD-10-CM | POA: Diagnosis not present

## 2016-02-26 DIAGNOSIS — E785 Hyperlipidemia, unspecified: Secondary | ICD-10-CM | POA: Diagnosis not present

## 2016-02-26 DIAGNOSIS — I2510 Atherosclerotic heart disease of native coronary artery without angina pectoris: Secondary | ICD-10-CM | POA: Diagnosis not present

## 2016-02-26 DIAGNOSIS — K1230 Oral mucositis (ulcerative), unspecified: Secondary | ICD-10-CM | POA: Diagnosis not present

## 2016-02-26 DIAGNOSIS — Z95828 Presence of other vascular implants and grafts: Secondary | ICD-10-CM | POA: Diagnosis not present

## 2016-02-26 DIAGNOSIS — E119 Type 2 diabetes mellitus without complications: Secondary | ICD-10-CM | POA: Diagnosis not present

## 2016-02-26 DIAGNOSIS — I10 Essential (primary) hypertension: Secondary | ICD-10-CM | POA: Diagnosis not present

## 2016-02-26 DIAGNOSIS — Z7982 Long term (current) use of aspirin: Secondary | ICD-10-CM | POA: Diagnosis not present

## 2016-02-26 DIAGNOSIS — Z7951 Long term (current) use of inhaled steroids: Secondary | ICD-10-CM | POA: Diagnosis not present

## 2016-02-26 DIAGNOSIS — R682 Dry mouth, unspecified: Secondary | ICD-10-CM | POA: Diagnosis not present

## 2016-02-26 DIAGNOSIS — Z8589 Personal history of malignant neoplasm of other organs and systems: Secondary | ICD-10-CM | POA: Diagnosis not present

## 2016-02-26 DIAGNOSIS — Z8249 Family history of ischemic heart disease and other diseases of the circulatory system: Secondary | ICD-10-CM | POA: Diagnosis not present

## 2016-02-26 DIAGNOSIS — Z7984 Long term (current) use of oral hypoglycemic drugs: Secondary | ICD-10-CM | POA: Diagnosis not present

## 2016-02-26 DIAGNOSIS — Z951 Presence of aortocoronary bypass graft: Secondary | ICD-10-CM | POA: Diagnosis not present

## 2016-03-16 DIAGNOSIS — E109 Type 1 diabetes mellitus without complications: Secondary | ICD-10-CM | POA: Diagnosis not present

## 2016-03-16 DIAGNOSIS — I10 Essential (primary) hypertension: Secondary | ICD-10-CM | POA: Diagnosis not present

## 2016-03-27 DIAGNOSIS — I2510 Atherosclerotic heart disease of native coronary artery without angina pectoris: Secondary | ICD-10-CM | POA: Diagnosis not present

## 2016-03-27 DIAGNOSIS — I10 Essential (primary) hypertension: Secondary | ICD-10-CM | POA: Diagnosis not present

## 2016-03-27 DIAGNOSIS — C111 Malignant neoplasm of posterior wall of nasopharynx: Secondary | ICD-10-CM | POA: Diagnosis not present

## 2016-03-27 DIAGNOSIS — Z510 Encounter for antineoplastic radiation therapy: Secondary | ICD-10-CM | POA: Diagnosis not present

## 2016-03-27 DIAGNOSIS — K8020 Calculus of gallbladder without cholecystitis without obstruction: Secondary | ICD-10-CM | POA: Diagnosis not present

## 2016-03-27 DIAGNOSIS — Z951 Presence of aortocoronary bypass graft: Secondary | ICD-10-CM | POA: Diagnosis not present

## 2016-03-27 DIAGNOSIS — E785 Hyperlipidemia, unspecified: Secondary | ICD-10-CM | POA: Diagnosis not present

## 2016-03-27 DIAGNOSIS — E119 Type 2 diabetes mellitus without complications: Secondary | ICD-10-CM | POA: Diagnosis not present

## 2016-03-27 DIAGNOSIS — C099 Malignant neoplasm of tonsil, unspecified: Secondary | ICD-10-CM | POA: Diagnosis not present

## 2016-03-27 DIAGNOSIS — M899 Disorder of bone, unspecified: Secondary | ICD-10-CM | POA: Diagnosis not present

## 2016-03-27 DIAGNOSIS — Z794 Long term (current) use of insulin: Secondary | ICD-10-CM | POA: Diagnosis not present

## 2016-04-02 DIAGNOSIS — C099 Malignant neoplasm of tonsil, unspecified: Secondary | ICD-10-CM | POA: Diagnosis not present

## 2016-05-19 ENCOUNTER — Other Ambulatory Visit: Payer: Self-pay | Admitting: Cardiology

## 2016-05-25 DIAGNOSIS — E119 Type 2 diabetes mellitus without complications: Secondary | ICD-10-CM | POA: Diagnosis not present

## 2016-05-25 DIAGNOSIS — D696 Thrombocytopenia, unspecified: Secondary | ICD-10-CM | POA: Diagnosis not present

## 2016-05-25 DIAGNOSIS — C099 Malignant neoplasm of tonsil, unspecified: Secondary | ICD-10-CM | POA: Diagnosis not present

## 2016-05-25 DIAGNOSIS — E785 Hyperlipidemia, unspecified: Secondary | ICD-10-CM | POA: Diagnosis not present

## 2016-05-25 DIAGNOSIS — Z951 Presence of aortocoronary bypass graft: Secondary | ICD-10-CM | POA: Diagnosis not present

## 2016-05-25 DIAGNOSIS — I10 Essential (primary) hypertension: Secondary | ICD-10-CM | POA: Diagnosis not present

## 2016-05-25 DIAGNOSIS — R682 Dry mouth, unspecified: Secondary | ICD-10-CM | POA: Diagnosis not present

## 2016-05-25 DIAGNOSIS — Z794 Long term (current) use of insulin: Secondary | ICD-10-CM | POA: Diagnosis not present

## 2016-05-25 DIAGNOSIS — Z8249 Family history of ischemic heart disease and other diseases of the circulatory system: Secondary | ICD-10-CM | POA: Diagnosis not present

## 2016-05-25 DIAGNOSIS — Z7982 Long term (current) use of aspirin: Secondary | ICD-10-CM | POA: Diagnosis not present

## 2016-05-25 DIAGNOSIS — I2510 Atherosclerotic heart disease of native coronary artery without angina pectoris: Secondary | ICD-10-CM | POA: Diagnosis not present

## 2016-06-11 ENCOUNTER — Other Ambulatory Visit: Payer: Self-pay | Admitting: Cardiology

## 2016-06-11 NOTE — Telephone Encounter (Signed)
PT NEEDS TO CALL THE OFFICE AND SCHEDULE A FOLLOW UP APPT FOR ANY FUTURE REFILLS. [phone]

## 2016-06-22 ENCOUNTER — Other Ambulatory Visit: Payer: Self-pay | Admitting: Cardiology

## 2016-07-22 ENCOUNTER — Other Ambulatory Visit: Payer: Self-pay | Admitting: Cardiology

## 2016-08-05 ENCOUNTER — Other Ambulatory Visit: Payer: Self-pay | Admitting: Cardiology

## 2016-08-27 ENCOUNTER — Other Ambulatory Visit: Payer: Self-pay | Admitting: Cardiology

## 2016-08-28 NOTE — Telephone Encounter (Signed)
Contacted Pt regarding refill request. Medication(Omega-3 acid) is refilled for the next two months to cover Pt until he can get an appointment. Pt is aware if he does not call and schedule an appointment, we will no longer be able to refill any medications until he is schedule and comes to appointment.

## 2016-09-08 ENCOUNTER — Other Ambulatory Visit: Payer: Self-pay | Admitting: Cardiovascular Disease

## 2016-09-08 ENCOUNTER — Other Ambulatory Visit: Payer: Self-pay | Admitting: Cardiology

## 2016-09-10 ENCOUNTER — Other Ambulatory Visit: Payer: Self-pay | Admitting: Cardiology

## 2016-10-15 ENCOUNTER — Other Ambulatory Visit: Payer: Self-pay | Admitting: Cardiology

## 2016-10-30 ENCOUNTER — Other Ambulatory Visit: Payer: Self-pay | Admitting: Cardiology

## 2017-10-22 MED ORDER — OXYBUTYNIN CHLORIDE 5 MG PO TABS
5.00 | ORAL_TABLET | ORAL | Status: DC
Start: 2017-10-22 — End: 2017-10-22

## 2017-10-22 MED ORDER — METOPROLOL SUCCINATE ER 50 MG PO TB24
50.00 | ORAL_TABLET | ORAL | Status: DC
Start: 2017-10-23 — End: 2017-10-22

## 2017-10-22 MED ORDER — LEVOTHYROXINE SODIUM 50 MCG PO TABS
50.00 | ORAL_TABLET | ORAL | Status: DC
Start: 2017-10-23 — End: 2017-10-22

## 2017-10-22 MED ORDER — SODIUM CHLORIDE FLUSH 0.9 % IV SOLN
5.00 | INTRAVENOUS | Status: DC
Start: 2017-10-22 — End: 2017-10-22

## 2017-10-22 MED ORDER — INSULIN LISPRO 100 UNIT/ML ~~LOC~~ SOLN
1.00 | SUBCUTANEOUS | Status: DC
Start: 2017-10-22 — End: 2017-10-22

## 2017-10-22 MED ORDER — FLUTICASONE PROPIONATE 50 MCG/ACT NA SUSP
2.00 | NASAL | Status: DC
Start: 2017-10-22 — End: 2017-10-22

## 2017-10-22 MED ORDER — ENOXAPARIN SODIUM 40 MG/0.4ML ~~LOC~~ SOLN
40.00 | SUBCUTANEOUS | Status: DC
Start: 2017-10-22 — End: 2017-10-22

## 2017-10-22 MED ORDER — ONDANSETRON HCL 4 MG/2ML IJ SOLN
4.00 | INTRAMUSCULAR | Status: DC
Start: ? — End: 2017-10-22

## 2017-10-22 MED ORDER — LOSARTAN POTASSIUM 50 MG PO TABS
100.00 | ORAL_TABLET | ORAL | Status: DC
Start: 2017-10-23 — End: 2017-10-22

## 2017-10-22 MED ORDER — GENERIC EXTERNAL MEDICATION
1.00 | Status: DC
Start: ? — End: 2017-10-22

## 2017-10-22 MED ORDER — GLUCOSE 40 % PO GEL
15.00 | ORAL | Status: DC
Start: ? — End: 2017-10-22

## 2017-10-22 MED ORDER — MAGNESIUM OXIDE 400 MG PO TABS
400.00 | ORAL_TABLET | ORAL | Status: DC
Start: 2017-10-22 — End: 2017-10-22

## 2017-10-22 MED ORDER — FUROSEMIDE 20 MG PO TABS
20.00 | ORAL_TABLET | ORAL | Status: DC
Start: 2017-10-22 — End: 2017-10-22

## 2017-10-22 MED ORDER — ACETAMINOPHEN 325 MG PO TABS
650.00 | ORAL_TABLET | ORAL | Status: DC
Start: ? — End: 2017-10-22

## 2017-10-22 MED ORDER — DEXTROSE 10 % IV SOLN
125.00 | INTRAVENOUS | Status: DC
Start: ? — End: 2017-10-22

## 2017-10-22 MED ORDER — SODIUM CHLORIDE FLUSH 0.9 % IV SOLN
5.00 | INTRAVENOUS | Status: DC
Start: ? — End: 2017-10-22

## 2017-10-22 MED ORDER — ATORVASTATIN CALCIUM 10 MG PO TABS
20.00 | ORAL_TABLET | ORAL | Status: DC
Start: 2017-10-22 — End: 2017-10-22

## 2017-10-22 MED ORDER — SERTRALINE HCL 50 MG PO TABS
50.00 | ORAL_TABLET | ORAL | Status: DC
Start: 2017-10-23 — End: 2017-10-22

## 2017-10-22 MED ORDER — TERAZOSIN HCL 5 MG PO CAPS
5.00 | ORAL_CAPSULE | ORAL | Status: DC
Start: 2017-10-22 — End: 2017-10-22

## 2017-10-22 MED ORDER — ASPIRIN 81 MG PO CHEW
81.00 | CHEWABLE_TABLET | ORAL | Status: DC
Start: 2017-10-22 — End: 2017-10-22

## 2021-01-22 ENCOUNTER — Emergency Department (HOSPITAL_BASED_OUTPATIENT_CLINIC_OR_DEPARTMENT_OTHER)
Admission: EM | Admit: 2021-01-22 | Discharge: 2021-01-22 | Disposition: A | Discharge status: Home / Self Care | Payer: PRIVATE HEALTH INSURANCE | Attending: Emergency Medicine | Admitting: Emergency Medicine

## 2021-01-22 ENCOUNTER — Other Ambulatory Visit: Payer: Self-pay

## 2021-01-22 ENCOUNTER — Emergency Department (HOSPITAL_BASED_OUTPATIENT_CLINIC_OR_DEPARTMENT_OTHER): Payer: PRIVATE HEALTH INSURANCE

## 2021-01-22 ENCOUNTER — Encounter (HOSPITAL_BASED_OUTPATIENT_CLINIC_OR_DEPARTMENT_OTHER): Payer: Self-pay | Admitting: Urology

## 2021-01-22 DIAGNOSIS — Z79899 Other long term (current) drug therapy: Secondary | ICD-10-CM | POA: Insufficient documentation

## 2021-01-22 DIAGNOSIS — Z7984 Long term (current) use of oral hypoglycemic drugs: Secondary | ICD-10-CM | POA: Insufficient documentation

## 2021-01-22 DIAGNOSIS — S8001XA Contusion of right knee, initial encounter: Secondary | ICD-10-CM | POA: Diagnosis not present

## 2021-01-22 DIAGNOSIS — I10 Essential (primary) hypertension: Secondary | ICD-10-CM | POA: Diagnosis not present

## 2021-01-22 DIAGNOSIS — S8991XA Unspecified injury of right lower leg, initial encounter: Secondary | ICD-10-CM

## 2021-01-22 DIAGNOSIS — W010XXA Fall on same level from slipping, tripping and stumbling without subsequent striking against object, initial encounter: Secondary | ICD-10-CM | POA: Diagnosis not present

## 2021-01-22 DIAGNOSIS — Z794 Long term (current) use of insulin: Secondary | ICD-10-CM | POA: Insufficient documentation

## 2021-01-22 DIAGNOSIS — E119 Type 2 diabetes mellitus without complications: Secondary | ICD-10-CM | POA: Diagnosis not present

## 2021-01-22 NOTE — ED Triage Notes (Signed)
Fall on Friday, landed on right knee Extensive bruising to knee and up medial thigh Started on antibiotic Friday at [facility]

## 2021-01-22 NOTE — ED Provider Notes (Signed)
[facility] EMERGENCY DEPARTMENT Provider Note   CSN: [id number] Arrival date & time: 01/22/21  1353     History  Chief Complaint  Patient presents with   Nicholas Rosales is a 71 y.o. male with a past medical history of hypertension, hyperlipidemia and type 2 diabetes presenting today after a fall that occurred on Friday.  Patient reported he tripped in his workshop and fell directly on his right knee.  He does take Eliquis.  Denies hitting his head or any other injury.  Was seen at urgent care on Friday and given antibiotics.  They are unable to do any imaging due to staffing.  Denies any pain, still ambulatory and no decrease in sensation.  The swelling has increased so he was concerned that he may lose circulation due to the swelling.   Home Medications Prior to Admission medications   Medication Sig Start Date End Date Taking? Authorizing Provider  B Complex-C (SUPER B COMPLEX PO) Take 1 tablet by mouth daily.    [provider]  BYSTOLIC 5 MG tablet TAKE 1 TABLET (5 MG TOTAL) BY MOUTH DAILY. NEED TO SCHEDULE AN APPT FOR FURTHER REFILLS. 10/15/15   Hilty, Nadean Corwin, MD  doxazosin (CARDURA) 4 MG tablet Take 4 mg by mouth daily.    [provider]  fluticasone (FLONASE) 50 MCG/ACT nasal spray Place 1 spray into both nostrils daily. 05/08/15   [provider]  Glucosamine-Chondroit-Vit C-Mn (GLUCOSAMINE 1500 COMPLEX) CAPS Take 1 capsule by mouth daily.    [provider]  HYDROcodone-acetaminophen (NORCO/VICODIN) 5-325 MG tablet Take 1 tablet by mouth every 6 (six) hours as needed. 05/20/15   [provider]  insulin glargine (LANTUS) 100 UNIT/ML injection Inject 20 Units into the skin at bedtime.     [provider]  L-Lysine 500 MG CAPS Take 1 capsule by mouth daily.    [provider]  losartan (COZAAR) 100 MG tablet TAKE 1 TABLET (100 MG TOTAL) BY MOUTH DAILY. 08/05/16   Leonie Man, MD  losartan  (COZAAR) 100 MG tablet TAKE 1 TABLET (100 MG TOTAL) BY MOUTH DAILY. 09/10/16   Leonie Man, MD  magnesium oxide (MAG-OX) 400 (241.3 Mg) MG tablet Take 1 tablet by mouth 2 (two) times daily. 05/16/15   [provider]  metFORMIN (GLUCOPHAGE) 1000 MG tablet Take 1,000 mg by mouth 2 (two) times daily with a meal.    [provider]  NITROSTAT 0.4 MG SL tablet TAKE AS DIRECTED SUBLINGUALLY 10/15/16   Leonie Man, MD  omega-3 acid ethyl esters (LOVAZA) 1 g capsule Take 4 capsules (4 g total) by mouth at bedtime. WILL NO LONGER REFILL UNTIL SEEN BY MD. 08/28/16   Leonie Man, MD  ondansetron (ZOFRAN) 8 MG tablet as needed. 04/22/15   [provider]  sertraline (ZOLOFT) 50 MG tablet Take 50 mg by mouth daily.    [provider]  simvastatin (ZOCOR) 40 MG tablet TAKE 1 TABLET (40 MG TOTAL) BY MOUTH DAILY AT 6 PM. 06/11/16   Lorretta Harp, MD  solifenacin (VESICARE) 10 MG tablet Take 10 mg by mouth daily.    [provider]  Vitamin D, Ergocalciferol, (DRISDOL) 50000 UNITS CAPS capsule Take 50,000 Units by mouth every 7 (seven) days. Wednesday    [provider]      Allergies    Patient has no known allergies.    Review of Systems   Review of Systems  Physical Exam Updated Vital Signs BP (!) 123/99 (BP Location: Left Arm)    Pulse 73    Temp 98.3 F (36.8 C) (Oral)    Resp 20    Ht 5\' 8"  (1.727 m)    Wt 98 kg    SpO2 100%    BMI 32.84 kg/m  Physical Exam Vitals and nursing note reviewed.  Constitutional:      General: He is not in acute distress.    Appearance: Normal appearance. He is not ill-appearing.  HENT:     Head: Normocephalic and atraumatic.  Eyes:     General: No scleral icterus.    Conjunctiva/sclera: Conjunctivae normal.  Pulmonary:     Effort: Pulmonary effort is normal. No respiratory distress.  Musculoskeletal:     Comments: Anterior knee with extensive bruising and swelling.  Range of motion intact.   Negative ligamental and meniscal testing.  Strong DP pulses.  Skin:    General: Skin is warm and dry.     Findings: Bruising (Extensive bruising from the middle thigh all the way down the calf.) present. No rash.  Neurological:     Mental Status: He is alert.     Motor: No weakness.     Gait: Gait normal.  Psychiatric:        Mood and Affect: Mood normal.    ED Results / Procedures / Treatments   Labs (all labs ordered are listed, but only abnormal results are displayed) Labs Reviewed - No data to display  EKG None  Radiology No results found.  Procedures Procedures    Medications Ordered in ED Medications - No data to display  ED Course/ Medical Decision Making/ A&P                           Medical Decision Making Amount and/or Complexity of Data Reviewed Radiology: ordered.   71 year old male presented today after a fall that occurred on Friday on blood thinners.  Denies hitting his head or loss of consciousness.  Fell directly on his right knee.  Denies any pain or numbness however endorses large amount of swelling and bruising.  Wanted to make sure he still had circulation.   I personally ordered and reviewed a x-ray of the patient's right knee.  There were no signs of fracture or effusion.  My physical exam revealed strong DP pulses, normal cap refill and full range of motion of ankle and MCPs.  Also with full range of motion with the knee and no pain to palpation.  Very low suspicion for compartment syndrome, no signs of rhabdo.  At this time I believe patient is able to follow-up with PCP and treat his symptoms with RICE therapy.  He has an upcoming appointment with orthopedics on 1/31.   Final Clinical Impression(s) / ED Diagnoses Final diagnoses:  Injury of right knee, initial encounter    Rx / DC Orders Results and diagnoses were explained to the patient. Return precautions discussed in full. Patient had no additional questions and expressed complete  understanding.   This chart was dictated using voice recognition software.  Despite best efforts to proofread,  errors can occur which can change the documentation meaning.    Darliss Ridgel 01/22/21 1516    Margette Fast, MD 01/29/21 2010

## 2021-01-22 NOTE — Discharge Instructions (Addendum)
I believe you should follow-up with your primary care provider about this fall to assure that you are healing.  I have also attached a sports medicine office for you to follow-up with if you see fit.  This is not urgent.

## 2021-01-28 ENCOUNTER — Emergency Department (HOSPITAL_BASED_OUTPATIENT_CLINIC_OR_DEPARTMENT_OTHER)
Admission: EM | Admit: 2021-01-28 | Discharge: 2021-01-28 | Disposition: A | Discharge status: Home / Self Care | Payer: PRIVATE HEALTH INSURANCE | Attending: Emergency Medicine | Admitting: Emergency Medicine

## 2021-01-28 ENCOUNTER — Emergency Department (HOSPITAL_BASED_OUTPATIENT_CLINIC_OR_DEPARTMENT_OTHER): Payer: PRIVATE HEALTH INSURANCE

## 2021-01-28 ENCOUNTER — Encounter (HOSPITAL_BASED_OUTPATIENT_CLINIC_OR_DEPARTMENT_OTHER): Payer: Self-pay | Admitting: Urology

## 2021-01-28 ENCOUNTER — Other Ambulatory Visit: Payer: Self-pay

## 2021-01-28 DIAGNOSIS — Z794 Long term (current) use of insulin: Secondary | ICD-10-CM | POA: Diagnosis not present

## 2021-01-28 DIAGNOSIS — Z7984 Long term (current) use of oral hypoglycemic drugs: Secondary | ICD-10-CM | POA: Insufficient documentation

## 2021-01-28 DIAGNOSIS — Y9269 Other specified industrial and construction area as the place of occurrence of the external cause: Secondary | ICD-10-CM | POA: Insufficient documentation

## 2021-01-28 DIAGNOSIS — Z951 Presence of aortocoronary bypass graft: Secondary | ICD-10-CM | POA: Insufficient documentation

## 2021-01-28 DIAGNOSIS — S80911A Unspecified superficial injury of right knee, initial encounter: Secondary | ICD-10-CM | POA: Diagnosis not present

## 2021-01-28 DIAGNOSIS — I10 Essential (primary) hypertension: Secondary | ICD-10-CM | POA: Diagnosis not present

## 2021-01-28 DIAGNOSIS — I4891 Unspecified atrial fibrillation: Secondary | ICD-10-CM | POA: Insufficient documentation

## 2021-01-28 DIAGNOSIS — I2510 Atherosclerotic heart disease of native coronary artery without angina pectoris: Secondary | ICD-10-CM | POA: Diagnosis not present

## 2021-01-28 DIAGNOSIS — E119 Type 2 diabetes mellitus without complications: Secondary | ICD-10-CM | POA: Insufficient documentation

## 2021-01-28 DIAGNOSIS — Z79899 Other long term (current) drug therapy: Secondary | ICD-10-CM | POA: Diagnosis not present

## 2021-01-28 DIAGNOSIS — S8991XD Unspecified injury of right lower leg, subsequent encounter: Secondary | ICD-10-CM

## 2021-01-28 DIAGNOSIS — W010XXA Fall on same level from slipping, tripping and stumbling without subsequent striking against object, initial encounter: Secondary | ICD-10-CM | POA: Insufficient documentation

## 2021-01-28 DIAGNOSIS — Z7901 Long term (current) use of anticoagulants: Secondary | ICD-10-CM | POA: Diagnosis not present

## 2021-01-28 LAB — BASIC METABOLIC PANEL
Anion gap: 10 (ref 5–15)
BUN: 45 mg/dL — ABNORMAL HIGH (ref 8–23)
CO2: 24 mmol/L (ref 22–32)
Calcium: 9.3 mg/dL (ref 8.9–10.3)
Chloride: 101 mmol/L (ref 98–111)
Creatinine, Ser: 2.04 mg/dL — ABNORMAL HIGH (ref 0.61–1.24)
GFR, Estimated: 34 mL/min — ABNORMAL LOW (ref >60)
Glucose, Bld: 227 mg/dL — ABNORMAL HIGH (ref 70–99)
Potassium: 4.8 mmol/L (ref 3.5–5.1)
Sodium: 135 mmol/L (ref 135–145)

## 2021-01-28 LAB — CBC WITH DIFFERENTIAL/PLATELET
Abs Immature Granulocytes: 0.05 10*3/uL (ref 0.00–0.07)
Basophils Absolute: 0 10*3/uL (ref 0.0–0.1)
Basophils Relative: 0 %
Eosinophils Absolute: 0 10*3/uL (ref 0.0–0.5)
Eosinophils Relative: 1 %
HCT: 34.6 % — ABNORMAL LOW (ref 39.0–52.0)
Hemoglobin: 11.9 g/dL — ABNORMAL LOW (ref 13.0–17.0)
Immature Granulocytes: 1 %
Lymphocytes Relative: 10 %
Lymphs Abs: 0.6 10*3/uL — ABNORMAL LOW (ref 0.7–4.0)
MCH: 31 pg (ref 26.0–34.0)
MCHC: 34.4 g/dL (ref 30.0–36.0)
MCV: 90.1 fL (ref 80.0–100.0)
Monocytes Absolute: 0.6 10*3/uL (ref 0.1–1.0)
Monocytes Relative: 10 %
Neutro Abs: 4.5 10*3/uL (ref 1.7–7.7)
Neutrophils Relative %: 78 %
Platelets: 122 10*3/uL — ABNORMAL LOW (ref 150–400)
RBC: 3.84 MIL/uL — ABNORMAL LOW (ref 4.22–5.81)
RDW: 14.7 % (ref 11.5–15.5)
WBC: 5.8 10*3/uL (ref 4.0–10.5)
nRBC: 0 % (ref 0.0–0.2)

## 2021-01-28 LAB — HEPATIC FUNCTION PANEL
ALT: 18 U/L (ref 0–44)
AST: 25 U/L (ref 15–41)
Albumin: 4 g/dL (ref 3.5–5.0)
Alkaline Phosphatase: 52 U/L (ref 38–126)
Bilirubin, Direct: 0.2 mg/dL (ref 0.0–0.2)
Indirect Bilirubin: 1.2 mg/dL — ABNORMAL HIGH (ref 0.3–0.9)
Total Bilirubin: 1.4 mg/dL — ABNORMAL HIGH (ref 0.3–1.2)
Total Protein: 7.5 g/dL (ref 6.5–8.1)

## 2021-01-28 LAB — CK: Total CK: 108 U/L (ref 49–397)

## 2021-01-28 NOTE — ED Provider Notes (Addendum)
[facility] EMERGENCY DEPARTMENT Provider Note   CSN: [id number] Arrival date & time: 01/28/21  1635     History  Chief Complaint  Patient presents with   Knee Injury    Nicholas Rosales is a 71 y.o. male.  71 year old male with past medical history of aortic stenosis s/p TAVR, CAD s/p CABG, HFrEF, paroxysmal A. fib on Eliquis which she reports compliance with presents today for evaluation of right lower extremity swelling, discoloration, and pain and is now been going on since 1/13 when he had his initial fall.  Patient reports he was in his workshop when he tripped and fell directly onto his right knee.  Patient was evaluated in the emergency room on 1/18 patient reports he has had no improvement since then and the discoloration is getting worse.  He states the swelling is about the same since he was last evaluated.  He denies fever, chills, chest pain, shortness of breath, or other complaints.  The history is provided by the patient. No language interpreter was used.      Home Medications Prior to Admission medications   Medication Sig Start Date End Date Taking? Authorizing Provider  B Complex-C (SUPER B COMPLEX PO) Take 1 tablet by mouth daily.    [provider]  BYSTOLIC 5 MG tablet TAKE 1 TABLET (5 MG TOTAL) BY MOUTH DAILY. NEED TO SCHEDULE AN APPT FOR FURTHER REFILLS. 10/15/15   Hilty, Nadean Corwin, MD  doxazosin (CARDURA) 4 MG tablet Take 4 mg by mouth daily.    [provider]  fluticasone (FLONASE) 50 MCG/ACT nasal spray Place 1 spray into both nostrils daily. 05/08/15   [provider]  Glucosamine-Chondroit-Vit C-Mn (GLUCOSAMINE 1500 COMPLEX) CAPS Take 1 capsule by mouth daily.    [provider]  HYDROcodone-acetaminophen (NORCO/VICODIN) 5-325 MG tablet Take 1 tablet by mouth every 6 (six) hours as needed. 05/20/15   [provider]  insulin glargine (LANTUS) 100 UNIT/ML injection Inject 20 Units into the skin at bedtime.      [provider]  L-Lysine 500 MG CAPS Take 1 capsule by mouth daily.    [provider]  losartan (COZAAR) 100 MG tablet TAKE 1 TABLET (100 MG TOTAL) BY MOUTH DAILY. 08/05/16   Leonie Man, MD  losartan (COZAAR) 100 MG tablet TAKE 1 TABLET (100 MG TOTAL) BY MOUTH DAILY. 09/10/16   Leonie Man, MD  magnesium oxide (MAG-OX) 400 (241.3 Mg) MG tablet Take 1 tablet by mouth 2 (two) times daily. 05/16/15   [provider]  metFORMIN (GLUCOPHAGE) 1000 MG tablet Take 1,000 mg by mouth 2 (two) times daily with a meal.    [provider]  NITROSTAT 0.4 MG SL tablet TAKE AS DIRECTED SUBLINGUALLY 10/15/16   Leonie Man, MD  omega-3 acid ethyl esters (LOVAZA) 1 g capsule Take 4 capsules (4 g total) by mouth at bedtime. WILL NO LONGER REFILL UNTIL SEEN BY MD. 08/28/16   Leonie Man, MD  ondansetron (ZOFRAN) 8 MG tablet as needed. 04/22/15   [provider]  sertraline (ZOLOFT) 50 MG tablet Take 50 mg by mouth daily.    [provider]  simvastatin (ZOCOR) 40 MG tablet TAKE 1 TABLET (40 MG TOTAL) BY MOUTH DAILY AT 6 PM. 06/11/16   Lorretta Harp, MD  solifenacin (VESICARE) 10 MG tablet Take 10 mg by mouth daily.    [provider]  Vitamin D, Ergocalciferol, (DRISDOL) 50000 UNITS CAPS capsule Take 50,000 Units  by mouth every 7 (seven) days. Wednesday    [provider]      Allergies    Patient has no known allergies.    Review of Systems   Review of Systems  Constitutional:  Negative for activity change, chills and fever.  Respiratory:  Negative for shortness of breath.   Cardiovascular:  Negative for chest pain.  Gastrointestinal:  Negative for nausea.  Musculoskeletal:  Positive for arthralgias and joint swelling.  Skin:  Positive for color change.  Neurological:  Negative for weakness.  All other systems reviewed and are negative.  Physical Exam Updated Vital Signs BP 126/63 (BP Location: Left Arm)    Pulse  71    Temp 97.7 F (36.5 C) (Oral)    Resp 18    Ht 5\' 8"  (1.727 m)    Wt 98 kg    SpO2 98%    BMI 32.85 kg/m  Physical Exam Vitals and nursing note reviewed.  Constitutional:      General: He is not in acute distress.    Appearance: Normal appearance. He is not ill-appearing.  HENT:     Head: Normocephalic and atraumatic.     Nose: Nose normal.  Eyes:     General: No scleral icterus.    Extraocular Movements: Extraocular movements intact.     Conjunctiva/sclera: Conjunctivae normal.  Cardiovascular:     Rate and Rhythm: Normal rate and regular rhythm.     Pulses: Normal pulses.  Pulmonary:     Effort: Pulmonary effort is normal. No respiratory distress.     Breath sounds: Normal breath sounds. No wheezing or rales.  Abdominal:     General: There is no distension.     Tenderness: There is no abdominal tenderness.  Musculoskeletal:        General: Normal range of motion.     Cervical back: Normal range of motion.     Right lower leg: Edema present.     Left lower leg: No edema.     Comments: Right lower extremity with diffuse swelling including right knee, as well as generalized discoloration.  Tenderness to palpation present over knee joint, and distal right lower extremity. DP Pulses present bilaterally.  Strength 5/5.  Limited right knee flexion.  Skin:    General: Skin is warm and dry.  Neurological:     General: No focal deficit present.     Mental Status: He is alert. Mental status is at baseline.    ED Results / Procedures / Treatments   Labs (all labs ordered are listed, but only abnormal results are displayed) Labs Reviewed - No data to display  EKG None  Radiology No results found.  Procedures Procedures    Medications Ordered in ED Medications - No data to display  ED Course/ Medical Decision Making/ A&P                           Medical Decision Making Amount and/or Complexity of Data Reviewed Labs: ordered. Radiology: ordered. ECG/medicine  tests: ordered.   Medical Decision Making / ED Course   This patient presents to the ED for concern of worsening right knee pain, swelling, discoloration, this involves an extensive number of treatment options, and is a complaint that carries with it a high risk of complications and morbidity.  The differential diagnosis includes DVT, cellulitis, vascular compromise  MDM: 71 year old male with past medical history significant for multiple cardiac comorbidities including TAVR, paroxysmal A.  fib on Eliquis presents today for worsening right knee pain, swelling, discoloration.  Patient initially had mechanical fall 1/13 where he suffered trauma to his right knee and was evaluated 1/18 in the emergency room and discharge with plan for orthopedic follow-up.  Patient has not followed up with his primary care provider or orthopedics yet.  Patient has an appointment scheduled for 1/31.  Patient does have limited range of motion on exam today.  According to exam from recent visit he had full range of motion at that time.  Patient does have bilateral DP pulses.  Will evaluate with CBC, BMP, ultrasound of right lower extremity to rule out DVT, right knee x-ray.  Low concern that this is cellulitis.  Patient was also treated with a course of antibiotics on 1/13 when he was evaluated at an urgent care with doxycycline. Work-up significant for CBC without leukocytosis, hemoglobin of 11.9, thrombocytopenia with platelets of 122, BMP with glucose of 227, new renal insufficiency with creatinine of 2.04.  Patient does not have history of CKD and most recently had a creatinine of 1.07.  This raises concern for rhabdomyolysis.  We will add CK, hepatic function panel, UA. CK within normal, LFTs within normal with the exception of mild hyperbilirubinemia at 1.4.  After review of labs in care everywhere it appears patient has CKD stage III and creatinine is slightly improved from 1/10 where it was 2.27.  Patient also follows  with nephrology in the [facility].  With this in mind patient is appropriate for discharge given negative for DVT, preserved vascular function, and patient is able to ambulate difficulty.  Have provided patient with 2 additional orthopedic office information for him to reach out to them and see if he can be seen sooner than at 1/31.  And wife both in agreement and voiced understanding.   Additional history obtained: -Additional history obtained from care everywhere which confirms patient has CKD stage III, and creatinine at baseline is between 2-2.2. -External records from outside source obtained and reviewed including: Chart review including previous notes, labs, imaging, consultation notes   Lab Tests: -I ordered, reviewed, and interpreted labs.   The pertinent results include:   Labs Reviewed  CBC WITH DIFFERENTIAL/PLATELET - Abnormal; Notable for the following components:      Result Value   RBC 3.84 (*)    Hemoglobin 11.9 (*)    HCT 34.6 (*)    Platelets 122 (*)    Lymphs Abs 0.6 (*)    All other components within normal limits  BASIC METABOLIC PANEL - Abnormal; Notable for the following components:   Glucose, Bld 227 (*)    BUN 45 (*)    Creatinine, Ser 2.04 (*)    GFR, Estimated 34 (*)    All other components within normal limits  HEPATIC FUNCTION PANEL - Abnormal; Notable for the following components:   Total Bilirubin 1.4 (*)    Indirect Bilirubin 1.2 (*)    All other components within normal limits  CK  URINALYSIS, ROUTINE W REFLEX MICROSCOPIC      EKG  EKG Interpretation  Date/Time:    Ventricular Rate:    PR Interval:    QRS Duration:   QT Interval:    QTC Calculation:   R Axis:     Text Interpretation:           Imaging Studies ordered: I ordered imaging studies including right knee x-ray, right lower extremity DVT study I independently visualized and interpreted imaging. I agree  with the radiologist interpretation   Medicines ordered and  prescription drug management: No orders of the defined types were placed in this encounter.   -I have reviewed the patients home medicines and have made adjustments as needed  Reevaluation: After the interventions noted above, I reevaluated the patient and found that they have :stayed the same  Co morbidities that complicate the patient evaluation  Past Medical History:  Diagnosis Date   CAD S/P percutaneous coronary angioplasty May 2011   05/2009: Myoview - reversible anterolateral ischemia --> 70% SVG-diagonal --> PCI;; b) Myoview 10/2013 @ [facility]  - No ischmia or infarction with Normal EF.   CAD, multiple vessel 2005   referred for CABG; the LAD:  Proximal 80%, 90% mid; circumflex 100%, nondominant RCA 90%   Dyslipidemia, goal LDL below 70    History of percutaneous coronary intervention May 2011   SVG-Diag: mid - Verflex BMS 4.0 m x 28 mm (4.5 mm), distal 4.0 mm x 12 mm Liberte' BMS (4.5 mm);; Follow-up Myoview 04/2010 - 10 METS, no ischemia or infarction   Hypertension, essential, benign    Low testosterone    Mild aortic stenosis by prior echocardiogram 04/2010   EF 55%, Mod AoV calcification with Mild Stenosis (CRO bicuspid valve);; F/u Echo 03/2013: mild LVH, EF 55-60%, Gr 2DD , mild AS, mod LA dilation,  mild RA & RV dilation   Non-STEMI (non-ST elevated myocardial infarction) [facility]) May 2011   likely postprocedural   Obesity (BMI 30.0-34.9)    OSA on CPAP    S/P CABG x 4 2005   LIMA to LAD, SVG to OM, SVG-PDA, SVG-Diagonal   Type 2 diabetes mellitus with complications ([place])    Cad      Dispostion: Patient is appropriate for discharge.  Patient discharged in stable condition.  Orthopedic referral given.  Return precautions discussed.  Patient and wife voiced understanding and are in agreement with plan.  Final Clinical Impression(s) / ED Diagnoses Final diagnoses:  Injury of right knee, subsequent encounter    Rx / DC Orders ED Discharge Orders     None          Evlyn Courier, PA-C 01/28/21 1954    Evlyn Courier, PA-C 01/28/21 1955    Lucrezia Starch, MD 01/28/21 2134

## 2021-01-28 NOTE — ED Triage Notes (Signed)
Right knee pain and bruising worsening since last seen Swelling more extensive Redness and heat now radiating to right foot

## 2021-01-28 NOTE — Discharge Instructions (Addendum)
Your work-up in the emergency room was reassuring.  Does not show any signs of infection.  The ultrasound was without concern for DVT.  Your kidney function is elevated however it is consistent with recent creatinine done couple weeks ago.  Recommend calling orthopedics to see if he can get a sooner appointment than 1/31.  I have also provided you with 2 additional orthopedic groups above.  You can give them a call see if he can be seen sooner.  Also recommend increasing frequency of icing to 20 minutes every 3-4 hours.  Continue taking Tylenol for pain control.  If you have worsening symptoms please return to the emergency room

## 2023-02-27 IMAGING — US US EXTREM LOW VENOUS*R*
1 series · 14 of 24 positions shown · non-contrast
Comparison: None.

CLINICAL DATA: Pain, swelling

EXAM:
RIGHT LOWER EXTREMITY VENOUS DOPPLER ULTRASOUND
TECHNIQUE: Gray-scale sonography with compression, as well as color and duplex
ultrasound, were performed to evaluate the deep venous system(s)
from the level of the common femoral vein through the popliteal and
proximal calf veins.

[Series 1: us extrem low venous*right* · 14 of 30 slices shown]
[im 1/30]
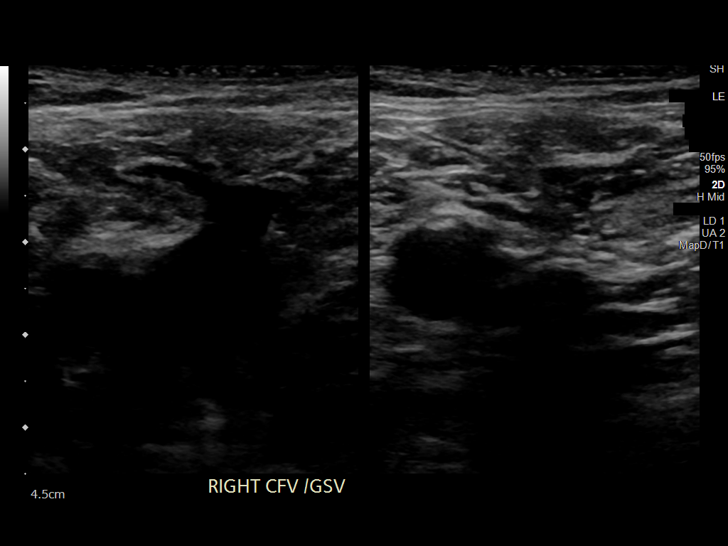
[im 3/30]
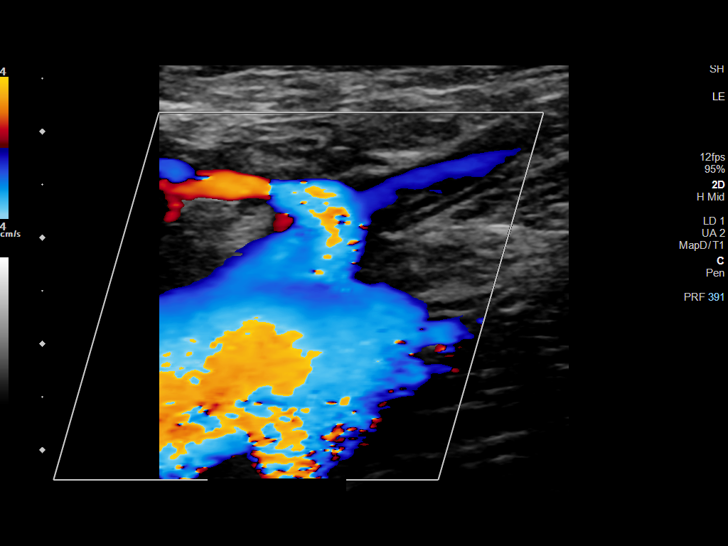
[im 6/30]
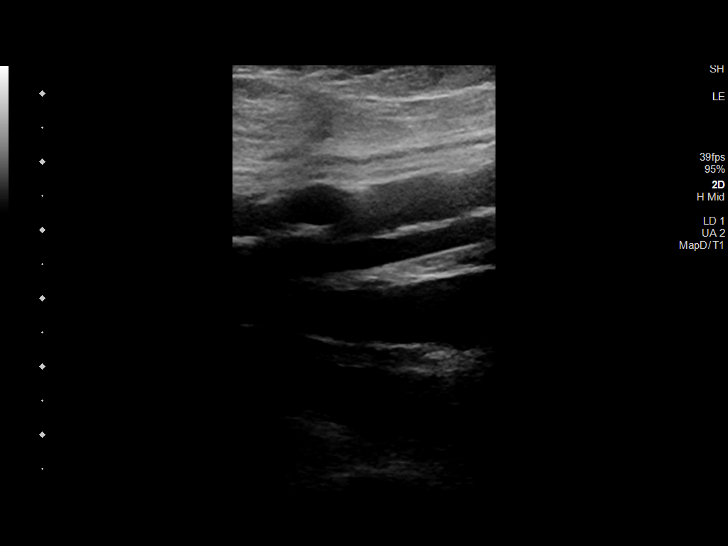
[im 8/30]
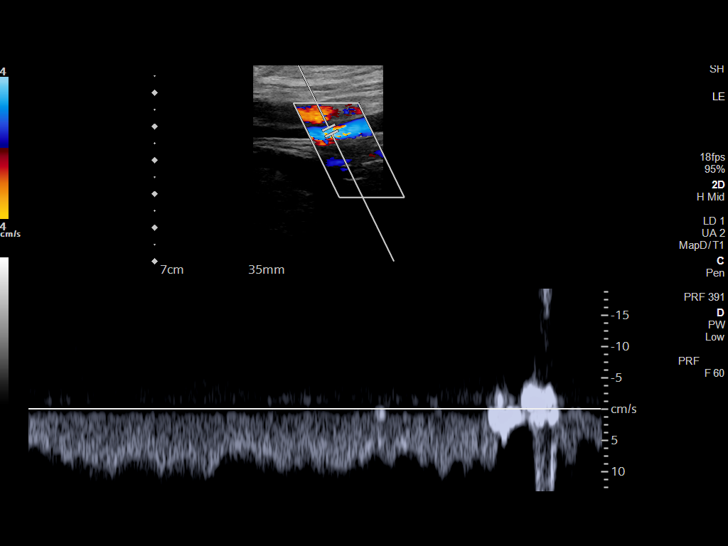
[im 9/30]
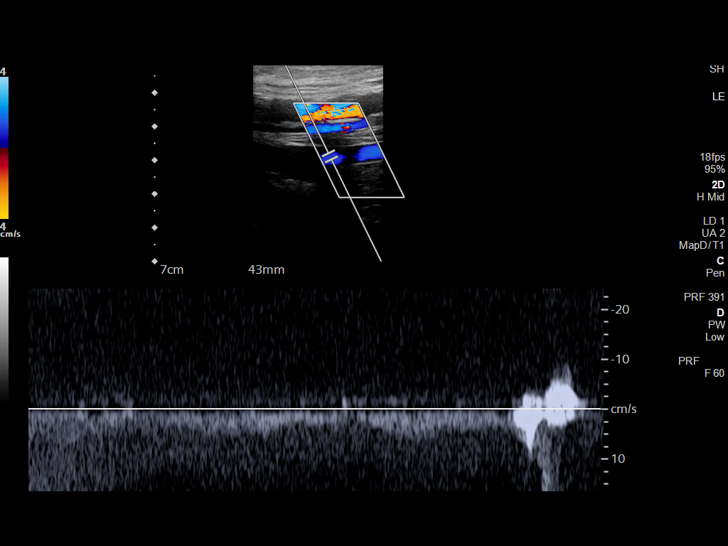
[im 12/30]
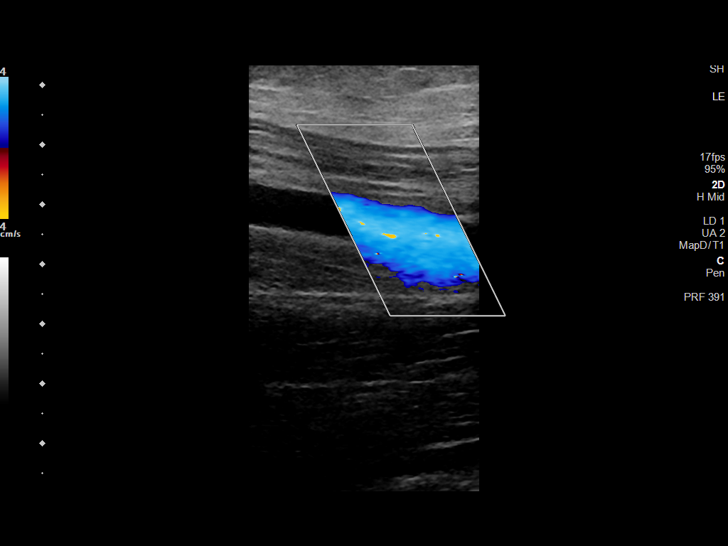
[im 14/30]
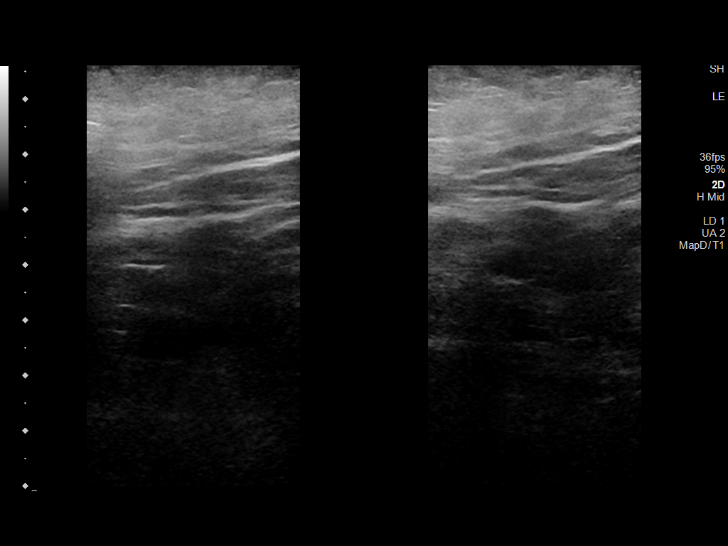
[im 16/30]
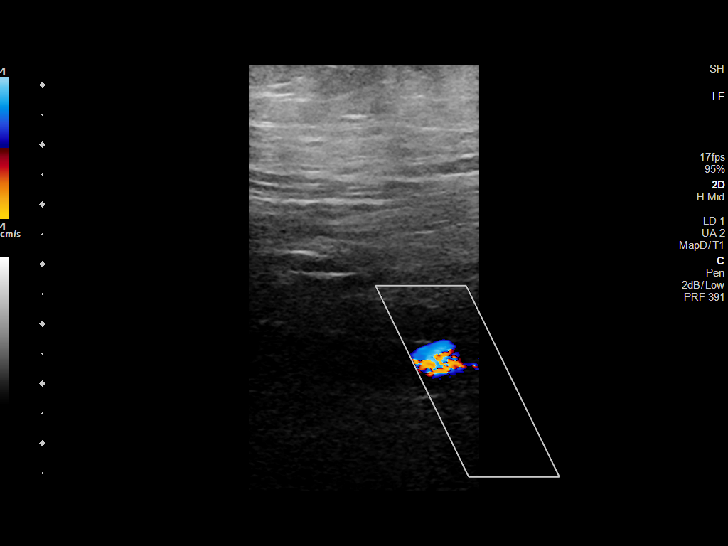
[im 18/30]
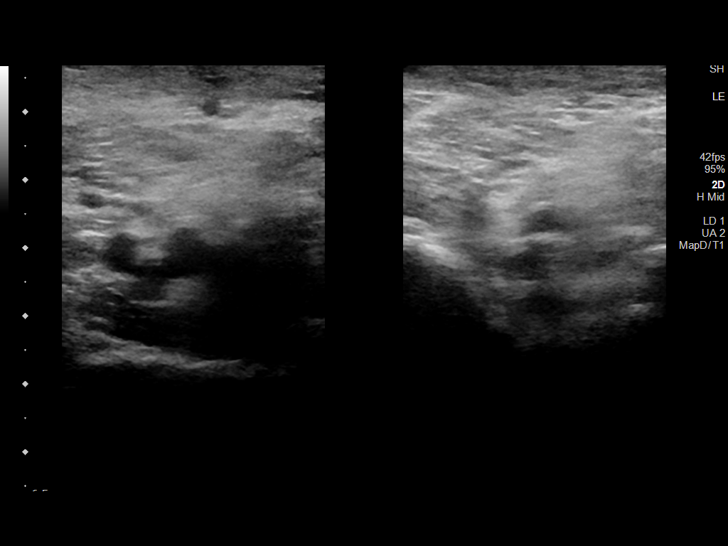
[im 21/30]
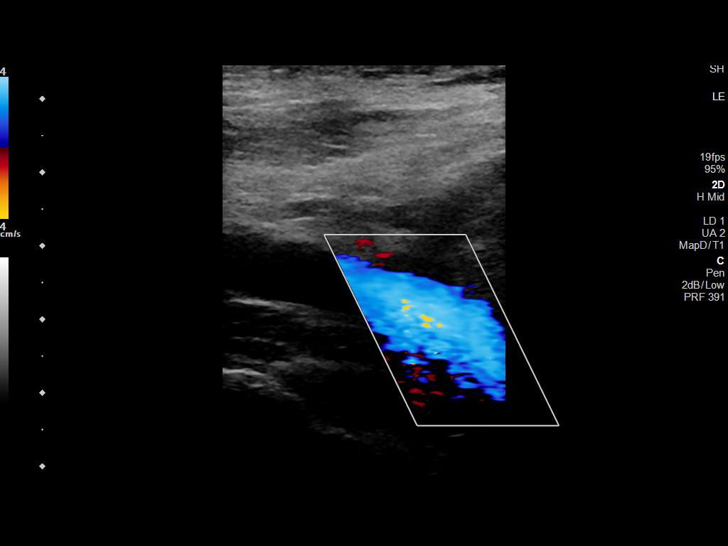
[im 23/30]
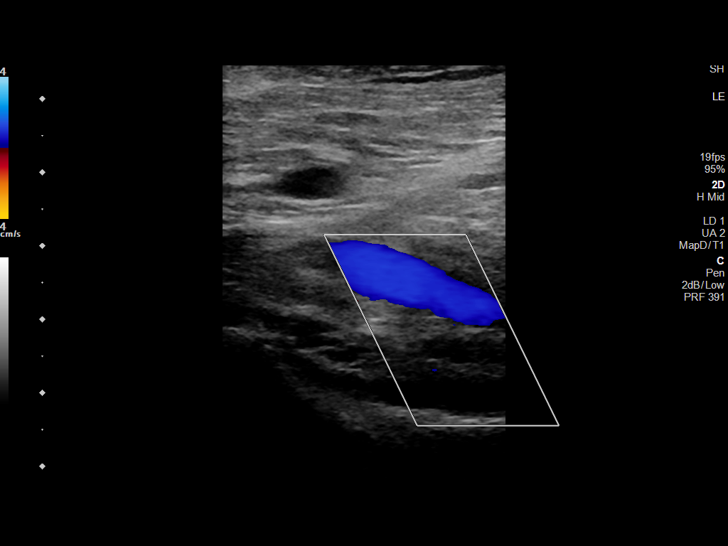
[im 24/30]
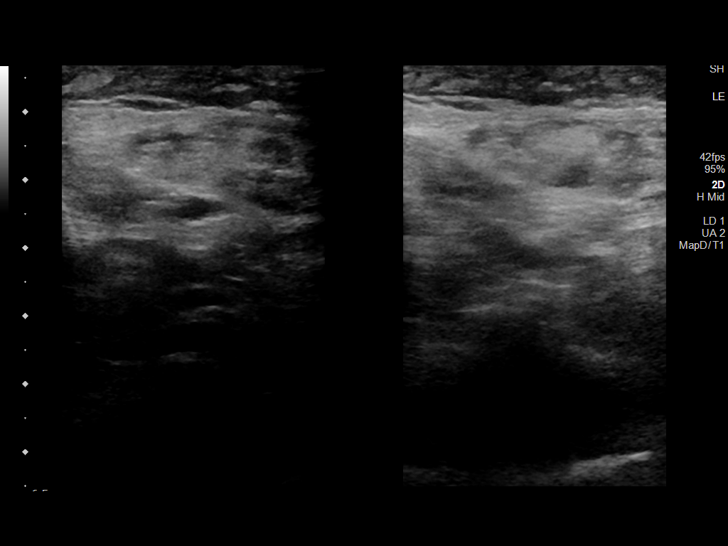
[im 27/30]
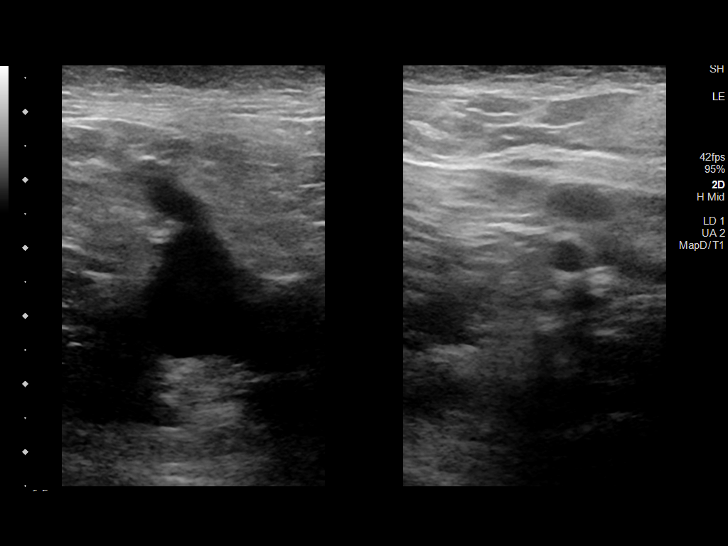
[im 30/30]
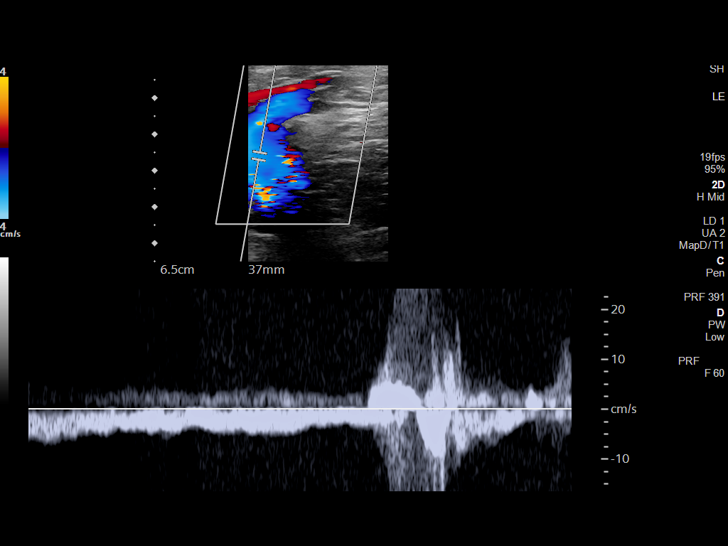

[14 of 24 positions shown; findings below may reference images not displayed]

FINDINGS: VENOUS

Normal compressibility of the common femoral, superficial femoral,
and popliteal veins, as well as the visualized calf veins.
Visualized portions of profunda femoral vein and great saphenous
vein unremarkable. No filling defects to suggest DVT on grayscale or
color Doppler imaging. Doppler waveforms show normal direction of
venous flow, normal respiratory plasticity and response to
augmentation.

Limited views of the contralateral common femoral vein are
unremarkable.

OTHER

None.

Limitations: none
IMPRESSION: Negative.

## 2023-03-17 ENCOUNTER — Emergency Department (HOSPITAL_BASED_OUTPATIENT_CLINIC_OR_DEPARTMENT_OTHER)
Admission: EM | Admit: 2023-03-17 | Discharge: 2023-03-17 | Discharge status: Against Medical Advice | Payer: Self-pay | Source: Home / Self Care

## 2023-03-17 ENCOUNTER — Other Ambulatory Visit: Payer: Self-pay

## 2023-04-09 ENCOUNTER — Emergency Department (HOSPITAL_BASED_OUTPATIENT_CLINIC_OR_DEPARTMENT_OTHER)

## 2023-04-09 ENCOUNTER — Encounter (HOSPITAL_BASED_OUTPATIENT_CLINIC_OR_DEPARTMENT_OTHER): Payer: Self-pay | Admitting: Urology

## 2023-04-09 ENCOUNTER — Emergency Department (HOSPITAL_BASED_OUTPATIENT_CLINIC_OR_DEPARTMENT_OTHER)
Admission: EM | Admit: 2023-04-09 | Discharge: 2023-04-09 | Disposition: A | Discharge status: Home / Self Care | Attending: Emergency Medicine | Admitting: Emergency Medicine

## 2023-04-09 ENCOUNTER — Other Ambulatory Visit: Payer: Self-pay

## 2023-04-09 ENCOUNTER — Other Ambulatory Visit (HOSPITAL_BASED_OUTPATIENT_CLINIC_OR_DEPARTMENT_OTHER): Payer: Self-pay

## 2023-04-09 DIAGNOSIS — Z8521 Personal history of malignant neoplasm of larynx: Secondary | ICD-10-CM | POA: Insufficient documentation

## 2023-04-09 DIAGNOSIS — Z20822 Contact with and (suspected) exposure to covid-19: Secondary | ICD-10-CM | POA: Insufficient documentation

## 2023-04-09 DIAGNOSIS — R059 Cough, unspecified: Secondary | ICD-10-CM | POA: Diagnosis present

## 2023-04-09 DIAGNOSIS — I4891 Unspecified atrial fibrillation: Secondary | ICD-10-CM | POA: Insufficient documentation

## 2023-04-09 DIAGNOSIS — Z950 Presence of cardiac pacemaker: Secondary | ICD-10-CM | POA: Insufficient documentation

## 2023-04-09 DIAGNOSIS — Z7901 Long term (current) use of anticoagulants: Secondary | ICD-10-CM | POA: Insufficient documentation

## 2023-04-09 DIAGNOSIS — J40 Bronchitis, not specified as acute or chronic: Secondary | ICD-10-CM | POA: Diagnosis not present

## 2023-04-09 LAB — BASIC METABOLIC PANEL WITH GFR
Anion gap: 10 (ref 5–15)
BUN: 18 mg/dL (ref 8–23)
CO2: 24 mmol/L (ref 22–32)
Calcium: 8.2 mg/dL — ABNORMAL LOW (ref 8.9–10.3)
Chloride: 98 mmol/L (ref 98–111)
Creatinine, Ser: 1.4 mg/dL — ABNORMAL HIGH (ref 0.61–1.24)
GFR, Estimated: 53 mL/min — ABNORMAL LOW (ref >60)
Glucose, Bld: 121 mg/dL — ABNORMAL HIGH (ref 70–99)
Potassium: 5.4 mmol/L — ABNORMAL HIGH (ref 3.5–5.1)
Sodium: 132 mmol/L — ABNORMAL LOW (ref 135–145)

## 2023-04-09 LAB — CBC WITH DIFFERENTIAL/PLATELET
Abs Immature Granulocytes: 0.02 10*3/uL (ref 0.00–0.07)
Basophils Absolute: 0 10*3/uL (ref 0.0–0.1)
Basophils Relative: 1 %
Eosinophils Absolute: 0 10*3/uL (ref 0.0–0.5)
Eosinophils Relative: 1 %
HCT: 31.6 % — ABNORMAL LOW (ref 39.0–52.0)
Hemoglobin: 10 g/dL — ABNORMAL LOW (ref 13.0–17.0)
Immature Granulocytes: 0 %
Lymphocytes Relative: 6 %
Lymphs Abs: 0.3 10*3/uL — ABNORMAL LOW (ref 0.7–4.0)
MCH: 27.9 pg (ref 26.0–34.0)
MCHC: 31.6 g/dL (ref 30.0–36.0)
MCV: 88.3 fL (ref 80.0–100.0)
Monocytes Absolute: 0.6 10*3/uL (ref 0.1–1.0)
Monocytes Relative: 11 %
Neutro Abs: 4.5 10*3/uL (ref 1.7–7.7)
Neutrophils Relative %: 81 %
Platelets: 125 10*3/uL — ABNORMAL LOW (ref 150–400)
RBC: 3.58 MIL/uL — ABNORMAL LOW (ref 4.22–5.81)
RDW: 14.9 % (ref 11.5–15.5)
WBC: 5.5 10*3/uL (ref 4.0–10.5)
nRBC: 0 % (ref 0.0–0.2)

## 2023-04-09 LAB — HEPATIC FUNCTION PANEL
ALT: 17 U/L (ref 0–44)
AST: 29 U/L (ref 15–41)
Albumin: 2.4 g/dL — ABNORMAL LOW (ref 3.5–5.0)
Alkaline Phosphatase: 54 U/L (ref 38–126)
Bilirubin, Direct: 0.4 mg/dL — ABNORMAL HIGH (ref 0.0–0.2)
Indirect Bilirubin: 0.8 mg/dL (ref 0.3–0.9)
Total Bilirubin: 1.2 mg/dL (ref 0.0–1.2)
Total Protein: 6.6 g/dL (ref 6.5–8.1)

## 2023-04-09 LAB — RESP PANEL BY RT-PCR (RSV, FLU A&B, COVID)  RVPGX2
Influenza A by PCR: NEGATIVE
Influenza B by PCR: NEGATIVE
Resp Syncytial Virus by PCR: NEGATIVE
SARS Coronavirus 2 by RT PCR: NEGATIVE

## 2023-04-09 MED ORDER — DOXYCYCLINE HYCLATE 100 MG PO TABS: 100.0000 mg | ORAL_TABLET | Freq: Once | ORAL | Status: DC

## 2023-04-09 MED ORDER — DOXYCYCLINE HYCLATE 100 MG PO CAPS
100.0000 mg | ORAL_CAPSULE | Freq: Two times a day (BID) | ORAL | 0 refills | Status: AC
  Filled 2023-04-09: qty 20, 10d supply, fill #0

## 2023-04-09 NOTE — Discharge Instructions (Signed)
 Follow-up with your doctor next week.  Continue antibiotics.  Return if symptoms worsen including fever.

## 2023-04-09 NOTE — ED Triage Notes (Signed)
 Pt has chest tube, is scheduled to have it removed on Tuesday Was hospitalized for pneumonia  States new cough x 2 days ago  States has been aspirating some r/t throat CA dx 6 years ago  Chemo and radiation damaged system per pt  Is on Oxygen 2 L [place] at baseline  Denies fever

## 2023-04-09 NOTE — ED Provider Notes (Signed)
 [place] [facility] Provider Note   CSN: [id number] Arrival date & time: 04/09/23  1432     History  Chief Complaint  Patient presents with   Cough    Nicholas Rosales is a 73 y.o. male.  Patient here with cough.  History of A-fib on blood thinners status post pacemaker, currently has pleural drain in for loculated pleural effusion.  He has been done with antibiotics.  He had only maybe 20 cc come out of his drain in the last week or so.  He supposed to get this out next week.  However cough came back 2 days ago.  He has aspiration history.  Throat cancer history.  Denies any sore throat chest pain shortness of breath weakness numbness tingling.  Denies any fevers or chills.  The history is provided by the patient.       Home Medications Prior to Admission medications   Medication Sig Start Date End Date Taking? Authorizing Provider  doxycycline (VIBRAMYCIN) 100 MG capsule Take 1 capsule (100 mg total) by mouth 2 (two) times daily. 04/09/23  Yes Oluwaseyi Raffel, DO  B Complex-C (SUPER B COMPLEX PO) Take 1 tablet by mouth daily.    [provider]  BYSTOLIC 5 MG tablet TAKE 1 TABLET (5 MG TOTAL) BY MOUTH DAILY. NEED TO SCHEDULE AN APPT FOR FURTHER REFILLS. 10/15/15   Hilty, Lisette Abu, MD  doxazosin (CARDURA) 4 MG tablet Take 4 mg by mouth daily.    [provider]  fluticasone (FLONASE) 50 MCG/ACT nasal spray Place 1 spray into both nostrils daily. 05/08/15   [provider]  Glucosamine-Chondroit-Vit C-Mn (GLUCOSAMINE 1500 COMPLEX) CAPS Take 1 capsule by mouth daily.    [provider]  HYDROcodone-acetaminophen (NORCO/VICODIN) 5-325 MG tablet Take 1 tablet by mouth every 6 (six) hours as needed. 05/20/15   [provider]  insulin glargine (LANTUS) 100 UNIT/ML injection Inject 20 Units into the skin at bedtime.     [provider]  L-Lysine 500 MG CAPS Take 1 capsule by mouth daily.    [provider]  losartan (COZAAR) 100 MG tablet TAKE 1 TABLET (100 MG TOTAL) BY MOUTH DAILY. 08/05/16   Marykay Lex, MD  losartan (COZAAR) 100 MG tablet TAKE 1 TABLET (100 MG TOTAL) BY MOUTH DAILY. 09/10/16   Marykay Lex, MD  magnesium oxide (MAG-OX) 400 (241.3 Mg) MG tablet Take 1 tablet by mouth 2 (two) times daily. 05/16/15   [provider]  metFORMIN (GLUCOPHAGE) 1000 MG tablet Take 1,000 mg by mouth 2 (two) times daily with a meal.    [provider]  NITROSTAT 0.4 MG SL tablet TAKE AS DIRECTED SUBLINGUALLY 10/15/16   Marykay Lex, MD  omega-3 acid ethyl esters (LOVAZA) 1 g capsule Take 4 capsules (4 g total) by mouth at bedtime. WILL NO LONGER REFILL UNTIL SEEN BY MD. 08/28/16   Marykay Lex, MD  ondansetron (ZOFRAN) 8 MG tablet as needed. 04/22/15   [provider]  sertraline (ZOLOFT) 50 MG tablet Take 50 mg by mouth daily.    [provider]  simvastatin (ZOCOR) 40 MG tablet TAKE 1 TABLET (40 MG TOTAL) BY MOUTH DAILY AT 6 PM. 06/11/16   Runell Gess, MD  solifenacin (VESICARE) 10 MG tablet Take 10 mg by mouth daily.    [provider]  Vitamin D, Ergocalciferol, (DRISDOL) 50000 UNITS CAPS capsule Take 50,000 Units by mouth every 7 (seven) days.  Wednesday    [provider]      Allergies    Patient has no known allergies.    Review of Systems   Review of Systems  Physical Exam Updated Vital Signs BP (!) 107/52 (BP Location: Right Arm)   Pulse 68   Temp 98.3 F (36.8 C)   Resp 18   Ht 5\' 8"  (1.727 m)   Wt 98 kg   SpO2 94%   BMI 32.85 kg/m  Physical Exam Vitals and nursing note reviewed.  Constitutional:      General: He is not in acute distress.    Appearance: He is well-developed. He is not ill-appearing.  HENT:     Head: Normocephalic and atraumatic.     Nose: Nose normal.     Mouth/Throat:     Mouth: Mucous membranes are moist.  Eyes:     Extraocular Movements: Extraocular movements intact.      Conjunctiva/sclera: Conjunctivae normal.     Pupils: Pupils are equal, round, and reactive to light.  Cardiovascular:     Rate and Rhythm: Normal rate and regular rhythm.     Pulses: Normal pulses.     Heart sounds: Normal heart sounds. No murmur heard. Pulmonary:     Effort: Pulmonary effort is normal. No respiratory distress.     Breath sounds: Normal breath sounds.  Abdominal:     Palpations: Abdomen is soft.     Tenderness: There is no abdominal tenderness.  Musculoskeletal:        General: No swelling.     Cervical back: Normal range of motion and neck supple.  Skin:    General: Skin is warm and dry.     Capillary Refill: Capillary refill takes less than 2 seconds.  Neurological:     General: No focal deficit present.     Mental Status: He is alert.  Psychiatric:        Mood and Affect: Mood normal.     ED Results / Procedures / Treatments   Labs (all labs ordered are listed, but only abnormal results are displayed) Labs Reviewed  CBC WITH DIFFERENTIAL/PLATELET - Abnormal; Notable for the following components:      Result Value   RBC 3.58 (*)    Hemoglobin 10.0 (*)    HCT 31.6 (*)    Platelets 125 (*)    Lymphs Abs 0.3 (*)    All other components within normal limits  BASIC METABOLIC PANEL WITH GFR - Abnormal; Notable for the following components:   Sodium 132 (*)    Potassium 5.4 (*)    Glucose, Bld 121 (*)    Creatinine, Ser 1.40 (*)    Calcium 8.2 (*)    GFR, Estimated 53 (*)    All other components within normal limits  HEPATIC FUNCTION PANEL - Abnormal; Notable for the following components:   Albumin 2.4 (*)    Bilirubin, Direct 0.4 (*)    All other components within normal limits  RESP PANEL BY RT-PCR (RSV, FLU A&B, COVID)  [id number]    EKG EKG Interpretation Date/Time:  Friday April 09 2023 15:17:35 EDT Ventricular Rate:  76 PR Interval:    QRS Duration:  167 QT Interval:  460 QTC Calculation: 518 R Axis:   -88  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 Left chest tube in  place for a loculated pleural effusion. EXAM: CHEST - 2 VIEW COMPARISON:  04/06/2023 and chest CT dated 03/28/2023. FINDINGS: The cardiac silhouette remains mildly enlarged. 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 Stable left subclavian pacer and AICD leads. Stable left basilar scarring/atelectasis and pleural thickening/fluid. Stable left basilar pigtail catheter. Clear right lung. Thoracic spine degenerative changes. IMPRESSION: 1. No significant change. 2. Stable left basilar scarring/atelectasis and pleural thickening/fluid with a left basilar pigtail catheter in place. 3. Stable mild cardiomegaly. Electronically Signed   By: Beckie Salts M.D.   On: 04/09/2023 16:08    Procedures Procedures    Medications Ordered in ED Medications - No data to display  ED Course/ Medical Decision Making/ A&P                                 Medical Decision Making Amount and/or Complexity of Data Reviewed Labs: ordered. Radiology: ordered.  Risk Prescription drug management.   Jackqulyn Livings is here for cough.  History of A-fib on blood thinners, history of recent pneumonia status post chest tube for complicated loculated effusion.  Started to have cough again 2 days ago but no fever.  He has not had really any drainage from his chest tube now the last 5 days.  He was to get it out next week.  He is on 2 L of oxygen at baseline.  He has a history of throat cancer with dysphagia and history of aspiration.  Overall he is well-appearing.  Normal vitals.  Clear breath sounds.  There is less than 20 cc of drainage in his chest tube drain.  That has been there maybe for the last few days.  Differential is likely bronchitis may be pneumonia but he does not appear septic.  Does not seem like a reactive  airway process.  Will check CBC BMP COVID flu test chest x-ray.  My review and interpretation of labs no significant anemia or electrolyte abnormality.  Potassium is 5.4 but with hemolysis.  EKG showed atrial fibrillation.  Rate controlled.  No leukocytosis.  Chest x-ray per radiology report with no significant change.  No new pneumonia.  Ultimately pigtail catheter is in place.  He has a cough no real sputum production.  Has really no major drainage from his catheter tube and does not seem to have any abnormality on chest x-ray.  Ultimately will be conservative start him on some antibiotics to be preventative, follow-up with his primary doctors and told to return if symptoms worsen.  Have no concern for ACS PE or other acute process.  Patient discharged in good condition.  This chart was dictated using voice recognition software.  Despite best efforts to proofread,  errors can occur which can change the documentation meaning.         Final Clinical Impression(s) / ED Diagnoses Final diagnoses:  Bronchitis    Rx / DC Orders ED Discharge Orders          Ordered    doxycycline (VIBRAMYCIN) 100 MG capsule  2 times daily        04/09/23 1655              [place], DO 04/09/23 1657

## 2023-04-09 NOTE — ED Notes (Signed)
 ED Provider at bedside.

## 2023-05-06 NOTE — Progress Notes (Signed)
 ok

## 2023-10-03 ENCOUNTER — Emergency Department (HOSPITAL_BASED_OUTPATIENT_CLINIC_OR_DEPARTMENT_OTHER)

## 2023-10-03 ENCOUNTER — Encounter (HOSPITAL_BASED_OUTPATIENT_CLINIC_OR_DEPARTMENT_OTHER): Payer: Self-pay

## 2023-10-03 ENCOUNTER — Emergency Department (HOSPITAL_BASED_OUTPATIENT_CLINIC_OR_DEPARTMENT_OTHER)
Admission: EM | Admit: 2023-10-03 | Discharge: 2023-10-04 | Disposition: A | Discharge status: Home / Self Care | Attending: Emergency Medicine | Admitting: Emergency Medicine

## 2023-10-03 ENCOUNTER — Other Ambulatory Visit: Payer: Self-pay

## 2023-10-03 DIAGNOSIS — I2510 Atherosclerotic heart disease of native coronary artery without angina pectoris: Secondary | ICD-10-CM | POA: Insufficient documentation

## 2023-10-03 DIAGNOSIS — R519 Headache, unspecified: Secondary | ICD-10-CM | POA: Insufficient documentation

## 2023-10-03 DIAGNOSIS — S51011A Laceration without foreign body of right elbow, initial encounter: Secondary | ICD-10-CM | POA: Insufficient documentation

## 2023-10-03 DIAGNOSIS — Z79899 Other long term (current) drug therapy: Secondary | ICD-10-CM | POA: Diagnosis not present

## 2023-10-03 DIAGNOSIS — E119 Type 2 diabetes mellitus without complications: Secondary | ICD-10-CM | POA: Diagnosis not present

## 2023-10-03 DIAGNOSIS — S61411A Laceration without foreign body of right hand, initial encounter: Secondary | ICD-10-CM | POA: Insufficient documentation

## 2023-10-03 DIAGNOSIS — Z7901 Long term (current) use of anticoagulants: Secondary | ICD-10-CM | POA: Insufficient documentation

## 2023-10-03 DIAGNOSIS — Z7984 Long term (current) use of oral hypoglycemic drugs: Secondary | ICD-10-CM | POA: Insufficient documentation

## 2023-10-03 DIAGNOSIS — S60511A Abrasion of right hand, initial encounter: Secondary | ICD-10-CM

## 2023-10-03 DIAGNOSIS — S6991XA Unspecified injury of right wrist, hand and finger(s), initial encounter: Secondary | ICD-10-CM | POA: Diagnosis present

## 2023-10-03 DIAGNOSIS — W19XXXA Unspecified fall, initial encounter: Secondary | ICD-10-CM | POA: Diagnosis not present

## 2023-10-03 DIAGNOSIS — Z794 Long term (current) use of insulin: Secondary | ICD-10-CM | POA: Insufficient documentation

## 2023-10-03 DIAGNOSIS — I4891 Unspecified atrial fibrillation: Secondary | ICD-10-CM | POA: Insufficient documentation

## 2023-10-03 DIAGNOSIS — I10 Essential (primary) hypertension: Secondary | ICD-10-CM | POA: Diagnosis not present

## 2023-10-03 DIAGNOSIS — S0081XA Abrasion of other part of head, initial encounter: Secondary | ICD-10-CM | POA: Diagnosis not present

## 2023-10-03 DIAGNOSIS — S0091XA Abrasion of unspecified part of head, initial encounter: Secondary | ICD-10-CM

## 2023-10-03 MED ORDER — SILVER NITRATE-POT NITRATE 75-25 % EX MISC
2.0000 | Freq: Once | CUTANEOUS | Status: AC
  Administered 2023-10-03: 2 via TOPICAL
  Filled 2023-10-03: qty 10

## 2023-10-03 NOTE — ED Triage Notes (Addendum)
 Pt lost balance at home and fell forward hitting his head on the driveway. Pt taking eliquis. Denies LOC. Reports 4/10 headache at this time post tylenol . Has abrasion and hematoma to R forehead. Injury also noted to R hand and R elbow.

## 2023-10-04 NOTE — ED Provider Notes (Signed)
 [place] [facility] Provider Note   CSN: [id number] Arrival date & time: 10/03/23  2033     Patient presents with: Nicholas Rosales is a 73 y.o. male with history of A-fib on Eliquis, NSTEMI, CAD, type 2 diabetes, hypertension, presents with concern for mechanical fall that occurred earlier today.  Reports he was bending over to lift up a vacuum hose, lost his balance, and his head and right arm against the floor.  He did not lose consciousness.  Reports bleeding to some abrasions on his head and right arm.  Denies any chest pain, shortness of breath, or dizziness before or after the fall.    Fall Pertinent negatives include no shortness of breath.       Prior to Admission medications   Medication Sig Start Date End Date Taking? Authorizing Provider  B Complex-C (SUPER B COMPLEX PO) Take 1 tablet by mouth daily.    [provider]  BYSTOLIC 5 MG tablet TAKE 1 TABLET (5 MG TOTAL) BY MOUTH DAILY. NEED TO SCHEDULE AN APPT FOR FURTHER REFILLS. 10/15/15   Hilty, Vinie BROCKS, MD  doxazosin (CARDURA) 4 MG tablet Take 4 mg by mouth daily.    [provider]  doxycycline  (VIBRAMYCIN ) 100 MG capsule Take 1 capsule (100 mg total) by mouth 2 (two) times daily. 04/09/23   Curatolo, Adam, DO  fluticasone  (FLONASE ) 50 MCG/ACT nasal spray Place 1 spray into both nostrils daily. 05/08/15   [provider]  Glucosamine-Chondroit-Vit C-Mn (GLUCOSAMINE 1500 COMPLEX) CAPS Take 1 capsule by mouth daily.    [provider]  HYDROcodone-acetaminophen  (NORCO/VICODIN) 5-325 MG tablet Take 1 tablet by mouth every 6 (six) hours as needed. 05/20/15   [provider]  insulin  glargine (LANTUS) 100 UNIT/ML injection Inject 20 Units into the skin at bedtime.     [provider]  L-Lysine 500 MG CAPS Take 1 capsule by mouth daily.    [provider]  losartan  (COZAAR ) 100 MG tablet TAKE 1 TABLET (100 MG TOTAL) BY  MOUTH DAILY. 08/05/16   Anner Alm ORN, MD  losartan  (COZAAR ) 100 MG tablet TAKE 1 TABLET (100 MG TOTAL) BY MOUTH DAILY. 09/10/16   Anner Alm ORN, MD  magnesium  oxide (MAG-OX) 400 (241.3 Mg) MG tablet Take 1 tablet by mouth 2 (two) times daily. 05/16/15   [provider]  metFORMIN (GLUCOPHAGE) 1000 MG tablet Take 1,000 mg by mouth 2 (two) times daily with a meal.    [provider]  NITROSTAT 0.4 MG SL tablet TAKE AS DIRECTED SUBLINGUALLY 10/15/16   Anner Alm ORN, MD  omega-3 acid ethyl esters (LOVAZA) 1 g capsule Take 4 capsules (4 g total) by mouth at bedtime. WILL NO LONGER REFILL UNTIL SEEN BY MD. 08/28/16   Anner Alm ORN, MD  ondansetron  (ZOFRAN ) 8 MG tablet as needed. 04/22/15   [provider]  sertraline  (ZOLOFT ) 50 MG tablet Take 50 mg by mouth daily.    [provider]  simvastatin  (ZOCOR ) 40 MG tablet TAKE 1 TABLET (40 MG TOTAL) BY MOUTH DAILY AT 6 PM. 06/11/16   Court Dorn PARAS, MD  solifenacin (VESICARE) 10 MG tablet Take 10 mg by mouth daily.    [provider]  Vitamin D, Ergocalciferol, (DRISDOL) 50000 UNITS CAPS capsule Take 50,000 Units by mouth every 7 (seven) days. Wednesday    [provider]    Allergies: Patient has no known allergies.    Review of Systems  Respiratory:  Negative for shortness of breath.   Neurological:  Negative for dizziness.    Updated Vital Signs BP 138/68   Pulse (!) 59   Temp 98.1 F (36.7 C) (Oral)   Resp 16   Ht 5' 8 (1.727 m)   Wt 79.4 kg   SpO2 98%   BMI 26.61 kg/m   Physical Exam Vitals and nursing note reviewed.  Constitutional:      General: He is not in acute distress.    Appearance: He is well-developed.  HENT:     Head: Normocephalic.  Eyes:     Extraocular Movements: Extraocular movements intact.     Conjunctiva/sclera: Conjunctivae normal.     Pupils: Pupils are equal, round, and reactive to light.  Cardiovascular:     Rate and Rhythm: Normal rate and  regular rhythm.     Heart sounds: No murmur heard.    Comments: 2+ radial and pedal pulses bilaterally Pulmonary:     Effort: Pulmonary effort is normal. No respiratory distress.     Breath sounds: Normal breath sounds.  Abdominal:     Palpations: Abdomen is soft.     Tenderness: There is no abdominal tenderness.  Musculoskeletal:        General: No swelling.     Cervical back: Neck supple.     Comments: General Large abrasion to the right side of patient's forehead with underlying edema.  Mild amount of bleeding continuing upon arrival.  Skin tear to the patient's right elbow and dorsum of the right hand.  Bleeding well-controlled upon arrival.  Palpation Non-tender to palpation of the clavicles,humerus, radius and ulna, carpal bones, 1st-5th metacarpals and phalanges bilaterally Non tender over the femur, patella, tibia or fibula bilaterally  Non-tender over the cervical, thoracic, or lumbar spinous processes. Non-tender to palpation of the paraspinal region of the back or chest wall diffusely  No tenderness of the pelvis diffusely  ROM Full ROM of shoulders bilaterally Full elbow, wrist, knee flexion and extension bilaterally Intact plantarflexion and dorsiflexion, hip flexion bilaterally     Skin:    General: Skin is warm and dry.     Capillary Refill: Capillary refill takes less than 2 seconds.  Neurological:     General: No focal deficit present.     Mental Status: He is alert and oriented to person, place, and time.     Comments: Sensation: Sensation intact throughout the bilateral upper and lower extremity  Strength: 5/5 strength with resisted elbow and wrist flexion and extension bilaterally 5/5 strength with resisted knee flexion and extension and ankle plantarflexion and dorsiflexion bilaterally  Psychiatric:        Mood and Affect: Mood normal.     (all labs ordered are listed, but only abnormal results are displayed) Labs Reviewed - No data to  display  EKG: None  Radiology: DG Hand Complete Right Result Date: 10/03/2023 CLINICAL DATA:  Fall, injury EXAM: RIGHT HAND - COMPLETE 3+ VIEW COMPARISON:  None Available. FINDINGS: Soft tissue swelling noted along the dorsum of the hand overlying the MCP joints. No acute bony abnormality. Specifically, no fracture, subluxation, or dislocation. Arthritic changes in the DIP joints and 1st carpometacarpal joint. IMPRESSION: No acute bony abnormality. Electronically Signed   By: Franky Crease M.D.   On: 10/03/2023 21:24   DG Elbow Complete Right Result Date: 10/03/2023 CLINICAL DATA:  Fall, injury, pain EXAM: RIGHT ELBOW - COMPLETE 3+ VIEW COMPARISON:  None available FINDINGS: Arthritic changes in the right elbow with  joint space narrowing and spurring. No joint effusion. No acute bony abnormality. Specifically, no fracture, subluxation, or dislocation. IMPRESSION: No acute bony abnormality. Electronically Signed   By: Franky Crease M.D.   On: 10/03/2023 21:24   CT Cervical Spine Wo Contrast Result Date: 10/03/2023 CLINICAL DATA:  Status post fall. EXAM: CT CERVICAL SPINE WITHOUT CONTRAST TECHNIQUE: Multidetector CT imaging of the cervical spine was performed without intravenous contrast. Multiplanar CT image reconstructions were also generated. RADIATION DOSE REDUCTION: This exam was performed according to the departmental dose-optimization program which includes automated exposure control, adjustment of the mA and/or kV according to patient size and/or use of iterative reconstruction technique. COMPARISON:  None Available. FINDINGS: Alignment: There is mild reversal of the normal cervical spine lordosis with approximately 2 mm anterolisthesis of the C4 vertebral body noted on C5. Skull base and vertebrae: No acute fracture. 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 Soft tissues and spinal canal: No prevertebral fluid or swelling. No visible canal hematoma. Disc  levels: Marked severity endplate sclerosis, anterior osteophyte formation and posterior bony spurring are seen at the levels of C4-C5, C5-C6 and C6-C7. Very mild anterior osteophyte formation is seen at C3-C4. There is moderate severity intervertebral disc space narrowing at the levels of C5-C6 and C6-C7, with mild intervertebral disc space narrowing present throughout the remaining levels of the cervical spine. Marked severity bilateral multilevel facet joint hypertrophy is noted. Upper chest: There is moderate to marked severity biapical scarring and/or atelectasis. Other: None. IMPRESSION: 1. No acute fracture or traumatic subluxation of the cervical spine. 2. Marked severity multilevel degenerative changes, as described above. Electronically Signed   By: Suzen Dials M.D.   On: 10/03/2023 21:11   CT Head Wo Contrast Result Date: 10/03/2023 CLINICAL DATA:  Status post fall. EXAM: CT HEAD WITHOUT CONTRAST TECHNIQUE: Contiguous axial images were obtained from the base of the skull through the vertex without intravenous contrast. RADIATION DOSE REDUCTION: This exam was performed according to the departmental dose-optimization program which includes automated exposure control, adjustment of the mA and/or kV according to patient size and/or use of iterative reconstruction technique. COMPARISON:  None Available. FINDINGS: Brain: There is generalized cerebral atrophy with widening of the extra-axial spaces and ventricular dilatation. There are areas of decreased attenuation within the white matter tracts of the supratentorial brain, consistent with microvascular disease changes. A small chronic left cerebellar infarct is seen. Vascular: No hyperdense vessel or unexpected calcification. Skull: Normal. Negative for fracture or focal lesion. Sinuses/Orbits: No acute finding. Other: There is moderate severity right frontal scalp soft tissue swelling. Mild left frontal scalp soft tissue swelling is also noted near  the vertex. IMPRESSION: 1. Generalized cerebral atrophy and microvascular disease changes of the supratentorial brain. 2. Small chronic left cerebellar infarct. 3. Moderate severity right frontal scalp soft tissue swelling. 4. Mild left frontal scalp soft tissue swelling near the vertex. Electronically Signed   By: Suzen Dials M.D.   On: 10/03/2023 21:04     .Laceration Repair  Date/Time: 10/04/2023 1:12 AM  Performed by: Veta Palma, PA-C Authorized by: Veta Palma, PA-C   Consent:    Consent obtained:  Verbal   Consent given by:  Patient   Risks, benefits, and alternatives were discussed: yes     Risks discussed:  Infection, pain and poor cosmetic result   Alternatives discussed:  No treatment Universal protocol:    Procedure explained and questions answered to patient or proxy's satisfaction: yes  Imaging studies available: yes     Patient identity confirmed:  Verbally with patient Anesthesia:    Anesthesia method:  None Laceration details:    Location:  Hand   Hand location:  R hand, dorsum   Length (cm):  2 Exploration:    Imaging obtained: x-ray     Imaging outcome: foreign body not noted     Wound exploration: wound explored through full range of motion and entire depth of wound visualized     Wound extent: no foreign body, no nerve damage, no tendon damage and no underlying fracture   Treatment:    Area cleansed with:  Soap and water Skin repair:    Repair method:  Tissue adhesive (Dermabond) Repair type:    Repair type:  Simple Post-procedure details:    Dressing:  Open (no dressing)   Procedure completion:  Tolerated well, no immediate complications    Medications Ordered in the ED  silver nitrate applicators applicator 2 Stick (2 Sticks Topical Given 10/03/23 2341)                                    Medical Decision Making Amount and/or Complexity of Data Reviewed Radiology: ordered.  Risk Prescription drug  management.     Differential diagnosis includes but is not limited to intracranial hemorrhage, skull fracture, fracture, dislocation, contusion, laceration, abrasion, ACS, arrhythmia, orthostatic hypotension, seizure, hypoglycemia  ED Course:  Upon initial evaluation, patient is very well-appearing, no acute distress.  Stable vitals.  He has good story for mechanical fall as he was bending over to pick something up, and lost his balance.  He denies any prodromal symptoms such as dizziness, chest pain, shortness of breath.  He did not synopsize. No neurologic deficits on exam.  Low concern for ACS, arrhthymias, CVA, seizure. Do not feel patient needs any further workup into his fall such as troponin, EKG, CBC, or BMP.  He did hit his head and is on Eliquis, CT head imaging obtained upon arrival to the ER as well as CT cervical spine, and x-ray of the right elbow and right hand due to overlying abrasions.   Imaging Studies ordered: I ordered imaging studies including CT head, CT cervical spine, x-ray right elbow, x-ray right hand I independently visualized the imaging with scope of interpretation limited to determining acute life threatening conditions related to emergency care. Imaging showed  CT head: IMPRESSION:  1. Generalized cerebral atrophy and microvascular disease changes of  the supratentorial brain.  2. Small chronic left cerebellar infarct.  3. Moderate severity right frontal scalp soft tissue swelling.  4. Mild left frontal scalp soft tissue swelling near the vertex.   X-ray right hand, x-ray right elbow, and CT cervical spine without acute abnormality I agree with the radiologist interpretation  Medications Given: Silver nitrate  Upon re-evaluation, patient remains well-appearing with stable vitals.  He took Tylenol  prior to arrival which is controlling pain well.  CT head is without any evidence of intracranial hemorrhage or skull fracture.  CT cervical spine, x-ray of the  right hand, and x-ray of the right elbow is without acute abnormality.  He does not have any bony tenderness to palpation of the cervical, thoracic, or lumbar spine, no bony tenderness palpation of the upper or lower extremities diffusely.  He has full range of motion of the upper and lower extremities bilaterally.  I have low concern for other acute fracture  or dislocation.  Patient has a large abrasion to the forehead which is still bleeding upon arrival.  Pressure dressing was applied and bleeding did not stop with pressure dressing.  Ultimately, both silver nitrate and clotting powder was used to control bleeding.  The abrasion to her right elbow was cleaned and dressed with bacitracin ointment and nonstick dressing.  Dermabond was applied to the small to the dorsum of the patient's right hand.  Patient stable and appropriate for discharge home  Impression: Mechanical fall Forehead, right elbow, and right hand abrasions  Disposition:  The patient was discharged home with instructions to allow Dermabond to fall off on its own.  Keep abrasions clean with soap and water at home.  Tylenol  as needed for pain at home. Return precautions given.   This chart was dictated using voice recognition software, Dragon. Despite the best efforts of this provider to proofread and correct errors, errors may still occur which can change documentation meaning.       Final diagnoses:  Fall, initial encounter  Abrasion of head, initial encounter  Abrasion of right hand, initial encounter    ED Discharge Orders     None          Veta Palma, PA-C 10/04/23 0114    Ruthe Cornet, DO 10/04/23 1829

## 2023-10-04 NOTE — Discharge Instructions (Addendum)
 The CT of your head and neck did not show any acute abnormalities.  The x-ray of your right elbow and right hand did not show any fractures or dislocations.  Please keep your abrasions clean with soap and water.  Cover with an antibiotic ointment and nonstick dressing as shown here until your abrasions fully heal.  You may take up to 1000mg  of tylenol  every 6 hours as needed for pain. Do not take more then 4g per day.    Your cut today was covered with skin glue to help it heal.  Please follow the instructions below:  Do not scratch, rub, or pick at the adhesive. Leave tissue adhesive in place. It will come off naturally after 7-10 days. Do not place tape over the adhesive. The adhesive could come off the wound when you pull the tape off. Do not take baths, swim, or use a hot tubs. You can shower after the first 24 hours. Cover the dressing with a watertight covering when you take a shower. Do not use any soaps, petroleum jelly products, or ointments on the wound. Certain ointments can weaken the adhesive.    Please follow up with your PCP for any further concerns  Return the ER for any severe headaches, persistent  vomiting, bleeding that does not stop any other new or concerning symptoms.
